# Patient Record
Sex: Female | Born: 1965 | Race: White | Hispanic: No | Marital: Married | Smoking: Never smoker
Health system: Southern US, Community
[De-identification: ages and names within clinical notes are randomized; demographics above are authoritative.]

## PROBLEM LIST (undated history)

## (undated) DIAGNOSIS — Z9889 Other specified postprocedural states: Secondary | ICD-10-CM

## (undated) DIAGNOSIS — I10 Essential (primary) hypertension: Secondary | ICD-10-CM

## (undated) DIAGNOSIS — F32A Depression, unspecified: Secondary | ICD-10-CM

## (undated) DIAGNOSIS — F419 Anxiety disorder, unspecified: Secondary | ICD-10-CM

## (undated) DIAGNOSIS — R112 Nausea with vomiting, unspecified: Secondary | ICD-10-CM

## (undated) DIAGNOSIS — Z8489 Family history of other specified conditions: Secondary | ICD-10-CM

## (undated) DIAGNOSIS — A630 Anogenital (venereal) warts: Secondary | ICD-10-CM

## (undated) DIAGNOSIS — F329 Major depressive disorder, single episode, unspecified: Secondary | ICD-10-CM

## (undated) HISTORY — DX: Depression, unspecified: F32.A

## (undated) HISTORY — DX: Anogenital (venereal) warts: A63.0

---
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
## 1997-12-17 HISTORY — PX: KNEE ARTHROSCOPY W/ ACL RECONSTRUCTION: SHX1858

## 1998-12-17 HISTORY — PX: LASIK: SHX215

## 2002-12-17 HISTORY — PX: TUBAL LIGATION: SHX77

## 2007-11-21 ENCOUNTER — Ambulatory Visit: Payer: Self-pay

## 2008-03-31 ENCOUNTER — Ambulatory Visit: Payer: Self-pay | Admitting: Orthopaedic Surgery

## 2008-04-08 ENCOUNTER — Ambulatory Visit: Payer: Self-pay | Admitting: Orthopaedic Surgery

## 2008-10-06 ENCOUNTER — Ambulatory Visit: Payer: Self-pay | Admitting: Obstetrics and Gynecology

## 2008-12-17 HISTORY — PX: BUNIONECTOMY: SHX129

## 2008-12-17 HISTORY — PX: SHOULDER ARTHROSCOPY WITH SUBACROMIAL DECOMPRESSION: SHX5684

## 2009-11-22 ENCOUNTER — Ambulatory Visit: Payer: Self-pay | Admitting: Obstetrics and Gynecology

## 2009-11-23 ENCOUNTER — Ambulatory Visit: Payer: Self-pay | Admitting: Obstetrics and Gynecology

## 2010-09-25 ENCOUNTER — Ambulatory Visit: Payer: Self-pay | Admitting: Orthopaedic Surgery

## 2010-12-17 HISTORY — PX: SHOULDER ARTHROSCOPY WITH SUBACROMIAL DECOMPRESSION: SHX5684

## 2011-01-19 ENCOUNTER — Ambulatory Visit: Payer: Self-pay | Admitting: Family Medicine

## 2011-07-17 ENCOUNTER — Encounter (HOSPITAL_BASED_OUTPATIENT_CLINIC_OR_DEPARTMENT_OTHER)
Admission: RE | Admit: 2011-07-17 | Discharge: 2011-07-17 | Disposition: A | Discharge status: Home / Self Care | Payer: Managed Care, Other (non HMO) | Source: Ambulatory Visit | Attending: Podiatry | Admitting: Podiatry

## 2011-07-17 LAB — BASIC METABOLIC PANEL
BUN: 11 mg/dL (ref 6–23)
Chloride: 96 mEq/L (ref 96–112)
GFR calc Af Amer: >60 mL/min (ref >60)
GFR calc non Af Amer: >60 mL/min (ref >60)
Potassium: 3.7 mEq/L (ref 3.5–5.1)
Sodium: 137 mEq/L (ref 135–145)

## 2011-07-20 ENCOUNTER — Ambulatory Visit (HOSPITAL_BASED_OUTPATIENT_CLINIC_OR_DEPARTMENT_OTHER)
Admission: RE | Admit: 2011-07-20 | Discharge: 2011-07-20 | Disposition: A | Discharge status: Home / Self Care | Payer: Managed Care, Other (non HMO) | Source: Ambulatory Visit | Attending: Podiatry | Admitting: Podiatry

## 2011-07-20 DIAGNOSIS — I10 Essential (primary) hypertension: Secondary | ICD-10-CM | POA: Insufficient documentation

## 2011-07-20 DIAGNOSIS — M26609 Unspecified temporomandibular joint disorder, unspecified side: Secondary | ICD-10-CM | POA: Insufficient documentation

## 2011-07-20 DIAGNOSIS — M24676 Ankylosis, unspecified foot: Secondary | ICD-10-CM | POA: Insufficient documentation

## 2011-07-20 DIAGNOSIS — T8489XA Other specified complication of internal orthopedic prosthetic devices, implants and grafts, initial encounter: Secondary | ICD-10-CM | POA: Insufficient documentation

## 2011-07-20 DIAGNOSIS — IMO0002 Reserved for concepts with insufficient information to code with codable children: Secondary | ICD-10-CM | POA: Insufficient documentation

## 2011-07-20 DIAGNOSIS — M24673 Ankylosis, unspecified ankle: Secondary | ICD-10-CM | POA: Insufficient documentation

## 2011-07-20 DIAGNOSIS — Z01810 Encounter for preprocedural cardiovascular examination: Secondary | ICD-10-CM | POA: Insufficient documentation

## 2011-07-20 DIAGNOSIS — F329 Major depressive disorder, single episode, unspecified: Secondary | ICD-10-CM | POA: Insufficient documentation

## 2011-07-20 DIAGNOSIS — Y838 Other surgical procedures as the cause of abnormal reaction of the patient, or of later complication, without mention of misadventure at the time of the procedure: Secondary | ICD-10-CM | POA: Insufficient documentation

## 2011-07-20 DIAGNOSIS — F3289 Other specified depressive episodes: Secondary | ICD-10-CM | POA: Insufficient documentation

## 2011-07-20 DIAGNOSIS — Z01812 Encounter for preprocedural laboratory examination: Secondary | ICD-10-CM | POA: Insufficient documentation

## 2012-10-06 ENCOUNTER — Ambulatory Visit: Payer: Self-pay | Admitting: Obstetrics and Gynecology

## 2013-12-29 ENCOUNTER — Ambulatory Visit: Payer: Self-pay | Admitting: Obstetrics and Gynecology

## 2015-08-01 ENCOUNTER — Other Ambulatory Visit: Payer: Self-pay | Admitting: Obstetrics and Gynecology

## 2015-08-01 DIAGNOSIS — Z1231 Encounter for screening mammogram for malignant neoplasm of breast: Secondary | ICD-10-CM

## 2015-08-23 ENCOUNTER — Ambulatory Visit
Admission: RE | Admit: 2015-08-23 | Discharge: 2015-08-23 | Disposition: A | Discharge status: Home / Self Care | Payer: 59 | Source: Ambulatory Visit | Attending: Obstetrics and Gynecology | Admitting: Obstetrics and Gynecology

## 2015-08-23 DIAGNOSIS — Z1231 Encounter for screening mammogram for malignant neoplasm of breast: Secondary | ICD-10-CM | POA: Insufficient documentation

## 2015-08-26 NOTE — H&P (Signed)
Allison Howard is an 49 y.o. female with abnormal uterine bleeding.  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 Prior workup 6 yrs ago. Has 3 children, all by C/S.  Pertinent gyn hx:  No abnormal paps S/p BTL S/p C/S x3   Patient concerns: AUB- increasing. Several times a month as a long cycle. Goes through several pads an hour on worst days.  Screening: Last pap: 2014 neg with neg HPV in 2013.  History of pap smears: No hx of high risk cervical lesions Last Mammogram: 2015 neg .   Patient's last menstrual period was 07/27/2015 (exact date).    Past Medical History:  has a past medical history of Depression; Panic disorder; and Hypertension.  Past Surgical History:  has past surgical history that includes Cryotherapy for venereal warts; Cesarean section; Right ACL reconstruction; and Lasik surgery. Family History: family history includes Heart disease in her other; Hypertension in her father and other; Prostate cancer in her father; Stroke in her other. Social History:  reports that she has never smoked. She does not have any smokeless tobacco history on file. She reports that she does not drink alcohol or use illicit drugs.  Allergies: has No Known Allergies. Medications:  Current outpatient prescriptions:  . triamterene-hydrochlorothiazide (MAXZIDE-25) 37.5-25 mg tablet, Take one tablet by mouth one time daily, Disp: 90 tablet, Rfl: 3  Review of Systems: Review of Systems  Constitutional: Negative. Negative for fever and fatigue.  HENT: Negative.  Respiratory: Negative. Negative for shortness of breath.  Cardiovascular: Negative for chest pain and palpitations.  Gastrointestinal: Negative for abdominal pain, diarrhea and constipation.  Endocrine: Negative for cold intolerance.  Genitourinary: Negative for dysuria, frequency, hematuria, vaginal bleeding, vaginal discharge, difficulty urinating, menstrual problem, pelvic pain and  dyspareunia.  Neurological: Negative for dizziness and light-headedness.  Psychiatric/Behavioral:  No changes in mood     Exam:      Filed Vitals:   07/22/15 1541  BP: 128/71  Pulse: 76    Body mass index is 25.37 kg/(m^2).  Physical Exam  Constitutional: She is oriented to person, place, and time. She appears well-developed and well-nourished. No distress.  Eyes: No scleral icterus.  Neck: Normal range of motion. Neck supple. No tracheal deviation present. No thyromegaly present.  Cardiovascular: Normal rate, regular rhythm and normal heart sounds.  No murmur heard. Pulmonary/Chest: Effort normal and breath sounds normal. She has no wheezes. She has no rales. Right breast exhibits no inverted nipple, no mass, no nipple discharge, no skin change and no tenderness. Left breast exhibits no inverted nipple, no mass, no nipple discharge, no skin change and no tenderness. Breasts are symmetrical.  Abdominal: She exhibits no distension and no mass. There is no tenderness. There is no rebound and no guarding.  Genitourinary:  Pelvic exam: External: Tanner stage 5, normal female genitalia without lesions or masses Bladder: Normal size without masses or tenderness, well-supported Urethra: No lesions or discharge with palpation. Normal urethral size and location, no prolapse Vagina: normal physiological discharge, without lesions or masses Cervix: normal without lesions or masses Adnexa: normal bimanual exam without masses or fullness Uterus: Normal size and position without masses or tenderness.  Anus/Perineum: Normal external exam  Musculoskeletal: Normal range of motion.  Lymphadenopathy:  She has no cervical adenopathy.  Neurological: She is alert and oriented to person, place, and time.  Skin: Skin is warm and dry.  Psychiatric: She has a normal mood and affect.   ROS  Last menstrual period 07/27/2015. Physical  Exam  Assessment/Plan:  Failed SIS. Thick ES today  consistent with intramenses; however, patient aware that until OR visit and hysteroscopy, we do not have pathology nor do we know that no polyps or fibroids in endometrial cavity.   Plan for OR for D&C, hysteroscopy and Novasure ablation. Pt aware if pathology findings concerning for cancer, may need further procedures, including myomectomy, polypectomy. In some cases, our findings might preclude a Novasure ablation.  Risks and benefits of procedure discussed in detail.   Benjaman Kindler 08/26/2015, 2:23 PM

## 2015-08-31 ENCOUNTER — Encounter
Admission: RE | Admit: 2015-08-31 | Discharge: 2015-08-31 | Disposition: A | Discharge status: Home / Self Care | Payer: 59 | Source: Ambulatory Visit | Attending: Obstetrics and Gynecology | Admitting: Obstetrics and Gynecology

## 2015-08-31 DIAGNOSIS — Z01810 Encounter for preprocedural cardiovascular examination: Secondary | ICD-10-CM | POA: Diagnosis not present

## 2015-08-31 DIAGNOSIS — Z01812 Encounter for preprocedural laboratory examination: Secondary | ICD-10-CM | POA: Insufficient documentation

## 2015-08-31 HISTORY — DX: Other specified postprocedural states: Z98.890

## 2015-08-31 HISTORY — DX: Essential (primary) hypertension: I10

## 2015-08-31 HISTORY — DX: Nausea with vomiting, unspecified: R11.2

## 2015-08-31 HISTORY — DX: Anxiety disorder, unspecified: F41.9

## 2015-08-31 HISTORY — DX: Family history of other specified conditions: Z84.89

## 2015-08-31 LAB — BASIC METABOLIC PANEL
ANION GAP: 9 (ref 5–15)
BUN: 16 mg/dL (ref 6–20)
CALCIUM: 9.5 mg/dL (ref 8.9–10.3)
CO2: 31 mmol/L (ref 22–32)
Chloride: 99 mmol/L — ABNORMAL LOW (ref 101–111)
Creatinine, Ser: 1.03 mg/dL — ABNORMAL HIGH (ref 0.44–1.00)
GLUCOSE: 87 mg/dL (ref 65–99)
Potassium: 3.2 mmol/L — ABNORMAL LOW (ref 3.5–5.1)
SODIUM: 139 mmol/L (ref 135–145)

## 2015-08-31 LAB — CBC
HCT: 36.3 % (ref 35.0–47.0)
HEMOGLOBIN: 11.6 g/dL — AB (ref 12.0–16.0)
MCH: 25.5 pg — ABNORMAL LOW (ref 26.0–34.0)
MCHC: 32.1 g/dL (ref 32.0–36.0)
MCV: 79.5 fL — ABNORMAL LOW (ref 80.0–100.0)
Platelets: 269 10*3/uL (ref 150–440)
RBC: 4.56 MIL/uL (ref 3.80–5.20)
RDW: 16 % — AB (ref 11.5–14.5)
WBC: 5.1 10*3/uL (ref 3.6–11.0)

## 2015-08-31 NOTE — Patient Instructions (Signed)
  Your procedure is scheduled on: Monday Sept. 19, 2016. Report to Same Day Surgery. To find out your arrival time please call [phone] between 1PM - 3PM on Friday Sept.16, 2016 .  Remember: Instructions that are not followed completely may result in serious medical risk, up to and including death, or upon the discretion of your surgeon and anesthesiologist your surgery may need to be rescheduled.    __x__ 1. Do not eat food or drink liquids after midnight. No gum chewing or hard candies.     __x__ 2. No Alcohol for 24 hours before or after surgery.   ____ 3. Bring all medications with you on the day of surgery if instructed.    __x__ 4. Notify your doctor if there is any change in your medical condition     (cold, fever, infections).     Do not wear jewelry, make-up, hairpins, clips or nail polish.  Do not wear lotions, powders, or perfumes. You may wear deodorant.  Do not shave 48 hours prior to surgery. Men may shave face and neck.  Do not bring valuables to the hospital.    [facility] is not responsible for any belongings or valuables.               Contacts, dentures or bridgework may not be worn into surgery.  Leave your suitcase in the car. After surgery it may be brought to your room.  For patients admitted to the hospital, discharge time is determined by your treatment team.   Patients discharged the day of surgery will not be allowed to drive home.    Please read over the following fact sheets that you were given:   [facility] Preparing for Surgery  _x___ Take these medicines the morning of surgery with A SIP OF WATER:    1. Alprazolam is optional  ____ Fleet Enema (as directed)   ____ Use CHG Soap as directed  ____ Use inhalers on the day of surgery  ____ Stop metformin 2 days prior to surgery    ____ Take 1/2 of usual insulin dose the night before surgery and none on the morning of surgery.   ____ Stop Coumadin/Plavix/aspirin on does not apply.  __x__  Stop Anti-inflammatories now.  Can take Tylenol for pain.   ____ Stop supplements until after surgery.    ____ Bring C-Pap to the hospital.

## 2015-09-02 ENCOUNTER — Telehealth: Payer: Self-pay | Admitting: Obstetrics and Gynecology

## 2015-09-02 NOTE — Telephone Encounter (Signed)
Preop potassium low at 3.2.  Pt instructed to take 20 mEq of KCl po BID until surgery.

## 2015-09-05 ENCOUNTER — Encounter: Payer: Self-pay | Admitting: *Deleted

## 2015-09-05 ENCOUNTER — Ambulatory Visit: Payer: 59 | Admitting: Anesthesiology

## 2015-09-05 ENCOUNTER — Ambulatory Visit
Admission: RE | Admit: 2015-09-05 | Discharge: 2015-09-05 | Disposition: A | Discharge status: Home / Self Care | Payer: 59 | Source: Ambulatory Visit | Attending: Obstetrics and Gynecology | Admitting: Obstetrics and Gynecology

## 2015-09-05 ENCOUNTER — Encounter
Admission: RE | Disposition: A | Discharge status: Home / Self Care | Payer: Self-pay | Source: Ambulatory Visit | Attending: Obstetrics and Gynecology

## 2015-09-05 DIAGNOSIS — N882 Stricture and stenosis of cervix uteri: Secondary | ICD-10-CM | POA: Diagnosis not present

## 2015-09-05 DIAGNOSIS — Z8042 Family history of malignant neoplasm of prostate: Secondary | ICD-10-CM | POA: Insufficient documentation

## 2015-09-05 DIAGNOSIS — N939 Abnormal uterine and vaginal bleeding, unspecified: Secondary | ICD-10-CM | POA: Insufficient documentation

## 2015-09-05 DIAGNOSIS — Z8249 Family history of ischemic heart disease and other diseases of the circulatory system: Secondary | ICD-10-CM | POA: Diagnosis not present

## 2015-09-05 DIAGNOSIS — O3421 Maternal care for scar from previous cesarean delivery: Secondary | ICD-10-CM | POA: Insufficient documentation

## 2015-09-05 DIAGNOSIS — I10 Essential (primary) hypertension: Secondary | ICD-10-CM | POA: Insufficient documentation

## 2015-09-05 DIAGNOSIS — Z79899 Other long term (current) drug therapy: Secondary | ICD-10-CM | POA: Diagnosis not present

## 2015-09-05 DIAGNOSIS — F329 Major depressive disorder, single episode, unspecified: Secondary | ICD-10-CM | POA: Diagnosis not present

## 2015-09-05 DIAGNOSIS — Z823 Family history of stroke: Secondary | ICD-10-CM | POA: Diagnosis not present

## 2015-09-05 HISTORY — PX: CYSTOSCOPY: SHX5120

## 2015-09-05 HISTORY — PX: DILITATION & CURRETTAGE/HYSTROSCOPY WITH NOVASURE ABLATION: SHX5568

## 2015-09-05 LAB — TYPE AND SCREEN
ABO/RH(D): A POS
ANTIBODY SCREEN: NEGATIVE

## 2015-09-05 LAB — I-STAT 4 (NA, K, GLUC, HGB, HCT): Potassium: 3.2 mmol/L — ABNORMAL LOW

## 2015-09-05 LAB — POCT I-STAT 4, (NA,K, GLUC, HGB,HCT)
Glucose, Bld: 85 mg/dL (ref 65–99)
HCT: 36 % (ref 36.0–46.0)
Hemoglobin: 12.2 g/dL (ref 12.0–15.0)
Potassium: 3.2 mmol/L — ABNORMAL LOW (ref 3.5–5.1)
Sodium: 138 mmol/L (ref 135–145)

## 2015-09-05 LAB — ABO/RH: ABO/RH(D): A POS

## 2015-09-05 LAB — POCT PREGNANCY, URINE: Preg Test, Ur: NEGATIVE

## 2015-09-05 SURGERY — DILATATION & CURETTAGE/HYSTEROSCOPY WITH NOVASURE ABLATION
Anesthesia: General

## 2015-09-05 MED ORDER — DOCUSATE SODIUM 100 MG PO CAPS: 100.0000 mg | ORAL_CAPSULE | Freq: Two times a day (BID) | ORAL | Status: DC | PRN

## 2015-09-05 MED ORDER — DOCUSATE SODIUM 100 MG PO CAPS
100.0000 mg | ORAL_CAPSULE | Freq: Two times a day (BID) | ORAL | Status: DC | PRN
  Filled 2015-09-05: qty 1

## 2015-09-05 MED ORDER — IBUPROFEN 800 MG PO TABS: 800.0000 mg | ORAL_TABLET | Freq: Three times a day (TID) | ORAL | Status: DC | PRN

## 2015-09-05 MED ORDER — LACTATED RINGERS IV SOLN
INTRAVENOUS | Status: DC
  Administered 2015-09-05: 07:00:00 via INTRAVENOUS

## 2015-09-05 MED ORDER — GLYCOPYRROLATE 0.2 MG/ML IJ SOLN
INTRAMUSCULAR | Status: DC | PRN
  Administered 2015-09-05: .1 mg via INTRAVENOUS

## 2015-09-05 MED ORDER — MIDAZOLAM HCL 5 MG/5ML IJ SOLN
INTRAMUSCULAR | Status: DC | PRN
  Administered 2015-09-05: 2 mg via INTRAVENOUS

## 2015-09-05 MED ORDER — ONDANSETRON HCL 4 MG/2ML IJ SOLN
INTRAMUSCULAR | Status: DC | PRN
  Administered 2015-09-05: 4 mg via INTRAVENOUS

## 2015-09-05 MED ORDER — SCOPOLAMINE 1 MG/3DAYS TD PT72
1.0000 | MEDICATED_PATCH | Freq: Once | TRANSDERMAL | Status: DC
  Administered 2015-09-05: 1.5 mg via TRANSDERMAL

## 2015-09-05 MED ORDER — LIDOCAINE HCL (CARDIAC) 20 MG/ML IV SOLN
INTRAVENOUS | Status: DC | PRN
Start: 2015-09-05 — End: 2015-09-05
  Administered 2015-09-05: 40 mg via INTRAVENOUS

## 2015-09-05 MED ORDER — FENTANYL CITRATE (PF) 100 MCG/2ML IJ SOLN
INTRAMUSCULAR | Status: DC | PRN
  Administered 2015-09-05: 100 ug via INTRAVENOUS

## 2015-09-05 MED ORDER — FAMOTIDINE 20 MG PO TABS
20.0000 mg | ORAL_TABLET | Freq: Once | ORAL | Status: AC
  Administered 2015-09-05: 20 mg via ORAL

## 2015-09-05 MED ORDER — SILVER NITRATE-POT NITRATE 75-25 % EX MISC
CUTANEOUS | Status: AC
  Filled 2015-09-05: qty 2

## 2015-09-05 MED ORDER — PROPOFOL 10 MG/ML IV BOLUS
INTRAVENOUS | Status: DC | PRN
  Administered 2015-09-05: 150 mg via INTRAVENOUS

## 2015-09-05 MED ORDER — FENTANYL CITRATE (PF) 100 MCG/2ML IJ SOLN: 25.0000 ug | INTRAMUSCULAR | Status: DC | PRN

## 2015-09-05 MED ORDER — DEXAMETHASONE SODIUM PHOSPHATE 10 MG/ML IJ SOLN
INTRAMUSCULAR | Status: DC | PRN
  Administered 2015-09-05: 5 mg via INTRAVENOUS

## 2015-09-05 MED ORDER — ONDANSETRON HCL 4 MG/2ML IJ SOLN
4.0000 mg | Freq: Once | INTRAMUSCULAR | Status: DC | PRN
Start: 2015-09-05 — End: 2015-09-05

## 2015-09-05 MED ORDER — OXYCODONE-ACETAMINOPHEN 5-325 MG PO TABS: 1.0000 | ORAL_TABLET | Freq: Four times a day (QID) | ORAL | Status: DC | PRN

## 2015-09-05 MED ORDER — KETOROLAC TROMETHAMINE 30 MG/ML IJ SOLN
INTRAMUSCULAR | Status: DC | PRN
  Administered 2015-09-05: 30 mg via INTRAVENOUS

## 2015-09-05 SURGICAL SUPPLY — 15 items
CANISTER SUCT 1200ML W/VALVE (MISCELLANEOUS) ×2 IMPLANT
CATH ROBINSON RED A/P 16FR (CATHETERS) ×2 IMPLANT
GLOVE BIO SURGEON STRL SZ 6 (GLOVE) ×2 IMPLANT
GLOVE INDICATOR 6.5 STRL GRN (GLOVE) ×2 IMPLANT
GOWN STRL REUS W/ TWL LRG LVL3 (GOWN DISPOSABLE) ×2 IMPLANT
GOWN STRL REUS W/TWL LRG LVL3 (GOWN DISPOSABLE) ×2
IV LACTATED RINGERS 1000ML (IV SOLUTION) ×2 IMPLANT
KIT RM TURNOVER CYSTO AR (KITS) ×2 IMPLANT
NOVASURE ENDOMETRIAL ABLATION (MISCELLANEOUS) ×2 IMPLANT
NS IRRIG 500ML POUR BTL (IV SOLUTION) ×2 IMPLANT
PACK DNC HYST (MISCELLANEOUS) ×2 IMPLANT
PAD OB MATERNITY 4.3X12.25 (PERSONAL CARE ITEMS) ×2 IMPLANT
PAD PREP 24X41 OB/GYN DISP (PERSONAL CARE ITEMS) ×2 IMPLANT
TOWEL OR 17X26 4PK STRL BLUE (TOWEL DISPOSABLE) ×2 IMPLANT
TUBING CONNECTING 10 (TUBING) ×2 IMPLANT

## 2015-09-05 NOTE — Anesthesia Preprocedure Evaluation (Signed)
Anesthesia Evaluation  Patient identified by MRN, date of birth, ID band Patient awake    Reviewed: Allergy & Precautions, NPO status , Patient's Chart, lab work & pertinent test results  History of Anesthesia Complications (+) PONVNegative for: history of anesthetic complications  Airway Mallampati: II  TM Distance: >3 FB Neck ROM: Full    Dental  (+) Teeth Intact   Pulmonary neg pulmonary ROS,           Cardiovascular hypertension, Pt. on medications      Neuro/Psych negative neurological ROS     GI/Hepatic   Endo/Other    Renal/GU      Musculoskeletal   Abdominal   Peds  Hematology   Anesthesia Other Findings   Reproductive/Obstetrics                             Anesthesia Physical Anesthesia Plan  ASA: II  Anesthesia Plan: General   Post-op Pain Management:    Induction: Intravenous  Airway Management Planned: LMA  Additional Equipment:   Intra-op Plan:   Post-operative Plan:   Informed Consent: I have reviewed the patients History and Physical, chart, labs and discussed the procedure including the risks, benefits and alternatives for the proposed anesthesia with the patient or authorized representative who has indicated his/her understanding and acceptance.     Plan Discussed with:   Anesthesia Plan Comments:         Anesthesia Quick Evaluation

## 2015-09-05 NOTE — Discharge Instructions (Signed)
Discharge instructions after a hysteroscopy with dilation and curettage  Signs and Symptoms to Report  Call our office at [phone] if you have any of the following:   . Fever over 100.4 degrees or higher . Severe stomach pain not relieved with pain medications . Bright red bleeding that's heavier than a period that does not slow with rest after the first 24 hours . To go the bathroom a lot (frequency), you can't hold your urine (urgency), or it hurts when you empty your bladder (urinate) . Chest pain . Shortness of breath . Pain in the calves of your legs . Severe nausea and vomiting not relieved with anti-nausea medications . Any concerns  What You Can Expect after Surgery . You may see some pink tinged, bloody fluid. This is normal. You may also have cramping for several days.   Activities after Your Discharge Follow these guidelines to help speed your recovery at home: . Don't drive if you are in pain or taking narcotic pain medicine. You may drive when you can safely slam on the brakes, turn the wheel forcefully, and rotate your torso comfortably. This is typically 4-7 days. Practice in a parking lot or side street prior to attempting to drive regularly.  . Ask others to help with household chores for 4 weeks. . Don't do strenuous activities, exercises, or sports like vacuuming, tennis, squash, etc. until your doctor says it is safe to do so. . Walk as you feel able. Rest often since it may take a week or two for your energy level to return to normal.  . You may climb stairs . Avoid constipation:   -Eat fruits, vegetables, and whole grains. Eat small meals as your appetite will take time to return to normal.   -Drink 6 to 8 glasses of water each day unless your doctor has told you to limit your fluids.   -Use a laxative or stool softener as needed if constipation becomes a problem. You may take Miralax, metamucil, Citrucil, Colace, Senekot, FiberCon, etc. If this does not  relieve the constipation, try two tablespoons of Milk Of Magnesia every 8 hours until your bowels move.  . You may shower.  . Do not get in a hot tub, swimming pool, etc. until your doctor agrees. . Do not douche, use tampons, or have sex until your doctor says it is okay, usually about 2 weeks. . Take your pain medicine when you need it. The medicine may not work as well if the pain is bad.  Take the medicines you were taking before surgery. Other medications you might need are pain medications (ibuprofen), medications for constipation (Colace) and nausea medications (Zofran).        AMBULATORY SURGERY  DISCHARGE INSTRUCTIONS   1) The drugs that you were given will stay in your system until tomorrow so for the next 24 hours you should not:  A) Drive an automobile B) Make any legal decisions C) Drink any alcoholic beverage   2) You may resume regular meals tomorrow.  Today it is better to start with liquids and gradually work up to solid foods.  You may eat anything you prefer, but it is better to start with liquids, then soup and crackers, and gradually work up to solid foods.   3) Please notify your doctor immediately if you have any unusual bleeding, trouble breathing, redness and pain at the surgery site, drainage, fever, or pain not relieved by medication.    4) Additional Instructions:          Please contact your physician with any problems or Same Day Surgery at [phone], Monday through Friday 6 am to 4 pm, or [facility] number at [phone]. 

## 2015-09-05 NOTE — Anesthesia Procedure Notes (Signed)
Procedure Name: LMA Insertion Date/Time: 09/05/2015 7:45 AM Performed by: Kennon Holter Pre-anesthesia Checklist: Patient identified, Emergency Drugs available, Suction available, Patient being monitored and Timeout performed Patient Re-evaluated:Patient Re-evaluated prior to inductionOxygen Delivery Method: Circle system utilized Preoxygenation: Pre-oxygenation with 100% oxygen Intubation Type: IV induction LMA: LMA inserted LMA Size: 4.0 Number of attempts: 1 Placement Confirmation: positive ETCO2 and breath sounds checked- equal and bilateral

## 2015-09-05 NOTE — OR Nursing (Signed)
Dr. Ronelle Nigh notified of Potassium 3.2. No orders received.

## 2015-09-05 NOTE — Anesthesia Postprocedure Evaluation (Signed)
  Anesthesia Post-op Note  Patient: Allison Howard  Procedure(s) Performed: Procedure(s): FRACTIONAL DILATATION & CURETTAGE/HYSTEROSCOPY WITH NOVASURE ABLATION (N/A) CYSTOSCOPY, URETHOSCOPY (N/A)  Anesthesia type:General  Patient location: PACU  Post pain: Pain level controlled  Post assessment: Post-op Vital signs reviewed, Patient's Cardiovascular Status Stable, Respiratory Function Stable, Patent Airway and No signs of Nausea or vomiting  Post vital signs: Reviewed and stable  Last Vitals:  Filed Vitals:   09/05/15 0938  BP: 132/80  Pulse: 58  Temp:   Resp: 13    Level of consciousness: awake, alert  and patient cooperative  Complications: No apparent anesthesia complications

## 2015-09-05 NOTE — Op Note (Signed)
Operative Report Hysteroscopy with Dilation and Curettage; Novasure ablation   Indications: Abnormal uterine bleeding, cervical stenosis, 3 prior cesarean sections   Pre-operative Diagnosis: Abnormal uterine bleeding   Post-operative Diagnosis: same.  Procedure: 1. Exam under anesthesia 2. Fractional D&C with endocervical curettage  3. Hysteroscopy 4. Novasure endometrial ablation 5. Cystourethroscopy  Surgeon: Angelina Pih, MD  Assistant(s):  None  Anesthesia: General endotracheal anesthesia  Estimated Blood Loss:  Minimal         Intraoperative medications: toradol         Total IV Fluids: 559ml  Urine Output: 141ml         Specimens: Endocervical curettings, endometrial curettings         Complications:  None; patient tolerated the procedure well.         Disposition: PACU - hemodynamically stable.         Condition: stable  Findings: Uterus measuring 9 cm by sound; normal cervix, vagina, perineum. Cervical length: 4.5 cm Uterine cavity length: 4.5 cm Uterine cavity width: 3.6 cm Power in watts: 79 Total time: 1 min 30 sec  Indication for procedure/Consents: 49 y.o. P3  here for scheduled surgery for the aforementioned diagnoses.  She was attempted EMBx in the office, which failed due to cervical stenosis. Risks of surgery were discussed with the patient including but not limited to: bleeding which may require transfusion; infection which may require antibiotics; injury to bladder, uterus or surrounding organs requiring further procedures; intrauterine scarring which may impair future fertility; need for additional procedures including laparotomy or laparoscopy; and other postoperative/anesthesia complications. Written informed consent was obtained.    Procedure Details:   The patient was taken to the operating room where general anesthesia was administered and was found to be adequate. After a formal and adequate timeout was performed, she was placed in  the dorsal lithotomy position and examined with the above findings. She was then prepped and draped in the sterile manner. Her bladder was catheterized for an estimated amount of clear, yellow urine. A weighed speculum was then placed in the patient's vagina and a single tooth tenaculum was applied to the anterior lip of the cervix.  An endocervical currettage was performed. Her cervix was serially dilated to 15 [place] using Hanks dilators.The hysteroscope was introduced to reveal the above findings.The hysteroscope was also used to determine the level of the internal os, and measurements were confirmed. The uterine cavity was carefully examined, both ostia were recognized, and diffusely proliferative endometrium with polypoid fragments was noted.  A sharp curettage was then performed until there was a gritty texture in all four quadrants.   NOVASURE PROCEDURE DETAILS:   The cervix was further dilated to accommodate the NovaSure device.  The NovaSure device was inserted, and the cavity width was determined. Using a power of 79 watts, for 90 sec, the endometrial ablation was performed. The hysteroscope was not re-introduced into the uterine cavity, to decrease the risk of pelvic infection. The tenaculum was removed from the anterior lip of the cervix, and the vaginal speculum was removed after noting good hemostasis.    An uncomplicated cystourethroscopy was performed to assess integrity of bladder mucosa. No areas of abnormality were noted.  She received iv acetaminophen and Toradol prior to leaving the OR. The patient tolerated the procedure well and was taken to the recovery area awake, extubated and in stable condition.  The patient will be discharged to home as per PACU criteria.  Routine postoperative instructions given.  She was  prescribed Percocet, Ibuprofen and Colace.  She will follow up in the clinic in two weeks for postoperative evaluation.

## 2015-09-05 NOTE — Interval H&P Note (Signed)
History and Physical Interval Note:  09/05/2015 7:28 AM  Allison Howard  has presented today for surgery, with the diagnosis of ABNORMAL UTERINE BLEEDING  The various methods of treatment have been discussed with the patient and family. After consideration of risks, benefits and other options for treatment, the patient has consented to  Procedure(s): [place] (N/A) as a surgical intervention .  The patient's history has been reviewed, patient examined, no change in status, stable for surgery.  I have reviewed the patient's chart and labs.  Questions were answered to the patient's satisfaction.     Benjaman Kindler

## 2015-09-05 NOTE — Transfer of Care (Signed)
Immediate Anesthesia Transfer of Care Note  Patient: Allison Howard  Procedure(s) Performed: Procedure(s): FRACTIONAL DILATATION & CURETTAGE/HYSTEROSCOPY WITH NOVASURE ABLATION (N/A) CYSTOSCOPY, URETHOSCOPY (N/A)  Patient Location: PACU  Anesthesia Type:General  Level of Consciousness: awake, alert  and oriented  Airway & Oxygen Therapy: Patient Spontanous Breathing and Patient connected to face mask oxygen  Post-op Assessment: Report given to RN and Post -op Vital signs reviewed and stable  Post vital signs: Reviewed and stable  Last Vitals:  Filed Vitals:   09/05/15 0838  BP:   Pulse:   Temp: 36.1 C  Resp:     Complications: No apparent anesthesia complications

## 2015-09-06 ENCOUNTER — Encounter: Payer: Self-pay | Admitting: Obstetrics and Gynecology

## 2015-09-06 LAB — SURGICAL PATHOLOGY

## 2016-09-06 ENCOUNTER — Other Ambulatory Visit: Payer: Self-pay | Admitting: Family Medicine

## 2016-09-06 DIAGNOSIS — Z1231 Encounter for screening mammogram for malignant neoplasm of breast: Secondary | ICD-10-CM

## 2016-10-01 ENCOUNTER — Ambulatory Visit
Admission: RE | Admit: 2016-10-01 | Discharge: 2016-10-01 | Disposition: A | Discharge status: Home / Self Care | Payer: 59 | Source: Ambulatory Visit | Attending: Family Medicine | Admitting: Family Medicine

## 2016-10-01 DIAGNOSIS — Z1231 Encounter for screening mammogram for malignant neoplasm of breast: Secondary | ICD-10-CM | POA: Insufficient documentation

## 2017-03-18 DIAGNOSIS — I10 Essential (primary) hypertension: Secondary | ICD-10-CM | POA: Diagnosis not present

## 2017-03-18 DIAGNOSIS — L84 Corns and callosities: Secondary | ICD-10-CM | POA: Diagnosis not present

## 2017-06-18 DIAGNOSIS — M25476 Effusion, unspecified foot: Secondary | ICD-10-CM | POA: Diagnosis not present

## 2017-06-18 DIAGNOSIS — M25473 Effusion, unspecified ankle: Secondary | ICD-10-CM | POA: Diagnosis not present

## 2017-09-10 DIAGNOSIS — N951 Menopausal and female climacteric states: Secondary | ICD-10-CM | POA: Diagnosis not present

## 2017-09-10 DIAGNOSIS — I10 Essential (primary) hypertension: Secondary | ICD-10-CM | POA: Diagnosis not present

## 2017-09-10 DIAGNOSIS — Z0000 Encounter for general adult medical examination without abnormal findings: Secondary | ICD-10-CM | POA: Diagnosis not present

## 2017-11-12 DIAGNOSIS — J069 Acute upper respiratory infection, unspecified: Secondary | ICD-10-CM | POA: Diagnosis not present

## 2018-01-27 ENCOUNTER — Other Ambulatory Visit: Payer: Self-pay | Admitting: Family Medicine

## 2018-01-27 DIAGNOSIS — Z1231 Encounter for screening mammogram for malignant neoplasm of breast: Secondary | ICD-10-CM

## 2018-02-13 ENCOUNTER — Ambulatory Visit
Admission: RE | Admit: 2018-02-13 | Discharge: 2018-02-13 | Disposition: A | Discharge status: Home / Self Care | Payer: 59 | Source: Ambulatory Visit | Attending: Family Medicine | Admitting: Family Medicine

## 2018-02-13 DIAGNOSIS — Z1231 Encounter for screening mammogram for malignant neoplasm of breast: Secondary | ICD-10-CM | POA: Diagnosis present

## 2018-08-12 DIAGNOSIS — Z01419 Encounter for gynecological examination (general) (routine) without abnormal findings: Secondary | ICD-10-CM | POA: Diagnosis not present

## 2018-08-22 DIAGNOSIS — M654 Radial styloid tenosynovitis [de Quervain]: Secondary | ICD-10-CM | POA: Diagnosis not present

## 2018-11-18 DIAGNOSIS — R635 Abnormal weight gain: Secondary | ICD-10-CM | POA: Diagnosis not present

## 2018-11-18 DIAGNOSIS — Z0000 Encounter for general adult medical examination without abnormal findings: Secondary | ICD-10-CM | POA: Diagnosis not present

## 2018-11-18 DIAGNOSIS — I10 Essential (primary) hypertension: Secondary | ICD-10-CM | POA: Diagnosis not present

## 2018-11-19 DIAGNOSIS — R635 Abnormal weight gain: Secondary | ICD-10-CM | POA: Diagnosis not present

## 2018-11-28 DIAGNOSIS — R079 Chest pain, unspecified: Secondary | ICD-10-CM | POA: Diagnosis not present

## 2019-01-21 DIAGNOSIS — M7061 Trochanteric bursitis, right hip: Secondary | ICD-10-CM | POA: Diagnosis not present

## 2019-01-21 DIAGNOSIS — M25551 Pain in right hip: Secondary | ICD-10-CM | POA: Diagnosis not present

## 2019-07-06 ENCOUNTER — Encounter (INDEPENDENT_AMBULATORY_CARE_PROVIDER_SITE_OTHER): Payer: 59 | Admitting: Ophthalmology

## 2019-07-06 ENCOUNTER — Other Ambulatory Visit: Payer: Self-pay

## 2019-07-06 DIAGNOSIS — H35033 Hypertensive retinopathy, bilateral: Secondary | ICD-10-CM

## 2019-07-06 DIAGNOSIS — I10 Essential (primary) hypertension: Secondary | ICD-10-CM

## 2019-07-06 DIAGNOSIS — H43813 Vitreous degeneration, bilateral: Secondary | ICD-10-CM | POA: Diagnosis not present

## 2019-07-06 DIAGNOSIS — D3131 Benign neoplasm of right choroid: Secondary | ICD-10-CM

## 2020-04-07 ENCOUNTER — Other Ambulatory Visit: Payer: Self-pay

## 2020-04-07 ENCOUNTER — Encounter: Payer: Self-pay | Admitting: Emergency Medicine

## 2020-04-07 ENCOUNTER — Emergency Department
Admission: EM | Admit: 2020-04-07 | Discharge: 2020-04-08 | Disposition: A | Discharge status: Home / Self Care | Payer: 59 | Attending: Emergency Medicine | Admitting: Emergency Medicine

## 2020-04-07 ENCOUNTER — Emergency Department: Payer: 59

## 2020-04-07 DIAGNOSIS — J1282 Pneumonia due to coronavirus disease 2019: Secondary | ICD-10-CM | POA: Insufficient documentation

## 2020-04-07 DIAGNOSIS — U071 COVID-19: Secondary | ICD-10-CM | POA: Diagnosis present

## 2020-04-07 DIAGNOSIS — R110 Nausea: Secondary | ICD-10-CM | POA: Diagnosis not present

## 2020-04-07 DIAGNOSIS — R0602 Shortness of breath: Secondary | ICD-10-CM | POA: Insufficient documentation

## 2020-04-07 DIAGNOSIS — Z79899 Other long term (current) drug therapy: Secondary | ICD-10-CM | POA: Insufficient documentation

## 2020-04-07 DIAGNOSIS — I10 Essential (primary) hypertension: Secondary | ICD-10-CM | POA: Insufficient documentation

## 2020-04-07 DIAGNOSIS — M546 Pain in thoracic spine: Secondary | ICD-10-CM | POA: Insufficient documentation

## 2020-04-07 DIAGNOSIS — R509 Fever, unspecified: Secondary | ICD-10-CM | POA: Diagnosis not present

## 2020-04-07 LAB — CBC WITH DIFFERENTIAL/PLATELET
Abs Immature Granulocytes: 0.02 10*3/uL (ref 0.00–0.07)
Basophils Absolute: 0 10*3/uL (ref 0.0–0.1)
Basophils Relative: 0 %
Eosinophils Absolute: 0 10*3/uL (ref 0.0–0.5)
Eosinophils Relative: 0 %
HCT: 43 % (ref 36.0–46.0)
Hemoglobin: 14.8 g/dL (ref 12.0–15.0)
Immature Granulocytes: 1 %
Lymphocytes Relative: 26 %
Lymphs Abs: 1.1 10*3/uL (ref 0.7–4.0)
MCH: 30.6 pg (ref 26.0–34.0)
MCHC: 34.4 g/dL (ref 30.0–36.0)
MCV: 88.8 fL (ref 80.0–100.0)
Monocytes Absolute: 0.2 10*3/uL (ref 0.1–1.0)
Monocytes Relative: 6 %
Neutro Abs: 2.8 10*3/uL (ref 1.7–7.7)
Neutrophils Relative %: 67 %
Platelets: 176 10*3/uL (ref 150–400)
RBC: 4.84 MIL/uL (ref 3.87–5.11)
RDW: 12.7 % (ref 11.5–15.5)
WBC: 4.2 10*3/uL (ref 4.0–10.5)
nRBC: 0 % (ref 0.0–0.2)

## 2020-04-07 LAB — COMPREHENSIVE METABOLIC PANEL
ALT: 21 U/L (ref 0–44)
AST: 35 U/L (ref 15–41)
Albumin: 3.7 g/dL (ref 3.5–5.0)
Alkaline Phosphatase: 54 U/L (ref 38–126)
Anion gap: 9 (ref 5–15)
BUN: 22 mg/dL — ABNORMAL HIGH (ref 6–20)
CO2: 35 mmol/L — ABNORMAL HIGH (ref 22–32)
Calcium: 8.6 mg/dL — ABNORMAL LOW (ref 8.9–10.3)
Chloride: 91 mmol/L — ABNORMAL LOW (ref 98–111)
Creatinine, Ser: 1.1 mg/dL — ABNORMAL HIGH (ref 0.44–1.00)
GFR calc Af Amer: >60 mL/min (ref >60)
GFR calc non Af Amer: 58 mL/min — ABNORMAL LOW (ref >60)
Glucose, Bld: 135 mg/dL — ABNORMAL HIGH (ref 70–99)
Potassium: 3 mmol/L — ABNORMAL LOW (ref 3.5–5.1)
Sodium: 135 mmol/L (ref 135–145)
Total Bilirubin: 0.8 mg/dL (ref 0.3–1.2)
Total Protein: 7.1 g/dL (ref 6.5–8.1)

## 2020-04-07 LAB — TROPONIN I (HIGH SENSITIVITY): Troponin I (High Sensitivity): 8 ng/L (ref <18)

## 2020-04-07 NOTE — ED Triage Notes (Signed)
Patient ambulatory to triage with steady gait, without difficulty or distress noted, mask in place; +COVID yesterday; c/o fever 102 with chills, cough, SHOB and upper back pain

## 2020-04-08 LAB — ABO/RH: ABO/RH(D): A POS

## 2020-04-08 LAB — FIBRIN DERIVATIVES D-DIMER (ARMC ONLY): Fibrin derivatives D-dimer (ARMC): 309.28 ng/mL (FEU) (ref 0.00–499.00)

## 2020-04-08 LAB — PROCALCITONIN: Procalcitonin: <0.1 ng/mL

## 2020-04-08 LAB — HIV ANTIBODY (ROUTINE TESTING W REFLEX): HIV Screen 4th Generation wRfx: NONREACTIVE

## 2020-04-08 LAB — BRAIN NATRIURETIC PEPTIDE: B Natriuretic Peptide: 15 pg/mL (ref 0.0–100.0)

## 2020-04-08 LAB — FERRITIN: Ferritin: 172 ng/mL (ref 11–307)

## 2020-04-08 LAB — C-REACTIVE PROTEIN: CRP: 0.8 mg/dL (ref <1.0)

## 2020-04-08 LAB — LACTATE DEHYDROGENASE: LDH: 177 U/L (ref 98–192)

## 2020-04-08 LAB — FIBRINOGEN: Fibrinogen: 491 mg/dL — ABNORMAL HIGH (ref 210–475)

## 2020-04-08 MED ORDER — PREDNISONE 20 MG PO TABS: ORAL_TABLET | ORAL | 0 refills | Status: DC

## 2020-04-08 MED ORDER — AZITHROMYCIN 500 MG PO TABS
500.0000 mg | ORAL_TABLET | Freq: Once | ORAL | Status: AC
  Administered 2020-04-08: 500 mg via ORAL
  Filled 2020-04-08: qty 1

## 2020-04-08 MED ORDER — ALBUTEROL SULFATE HFA 108 (90 BASE) MCG/ACT IN AERS: 2.0000 | INHALATION_SPRAY | RESPIRATORY_TRACT | 0 refills | Status: DC | PRN

## 2020-04-08 MED ORDER — POTASSIUM CHLORIDE CRYS ER 20 MEQ PO TBCR
40.0000 meq | EXTENDED_RELEASE_TABLET | Freq: Once | ORAL | Status: DC
  Filled 2020-04-08: qty 2

## 2020-04-08 MED ORDER — AZITHROMYCIN 250 MG PO TABS: 250.0000 mg | ORAL_TABLET | Freq: Every day | ORAL | 0 refills | Status: DC

## 2020-04-08 MED ORDER — DEXAMETHASONE SODIUM PHOSPHATE 10 MG/ML IJ SOLN
6.0000 mg | Freq: Once | INTRAMUSCULAR | Status: AC
  Administered 2020-04-08: 6 mg via INTRAVENOUS
  Filled 2020-04-08: qty 1

## 2020-04-08 MED ORDER — SODIUM CHLORIDE 0.9 % IV BOLUS: 500.0000 mL | Freq: Once | INTRAVENOUS | Status: DC

## 2020-04-08 MED ORDER — ONDANSETRON HCL 4 MG/2ML IJ SOLN
4.0000 mg | Freq: Once | INTRAMUSCULAR | Status: AC
  Administered 2020-04-08: 4 mg via INTRAVENOUS
  Filled 2020-04-08: qty 2

## 2020-04-08 MED ORDER — KETOROLAC TROMETHAMINE 30 MG/ML IJ SOLN
10.0000 mg | Freq: Once | INTRAMUSCULAR | Status: AC
  Administered 2020-04-08: 01:00:00 9.9 mg via INTRAVENOUS
  Filled 2020-04-08: qty 1

## 2020-04-08 NOTE — Discharge Instructions (Addendum)
1.  You may continue to use the cough medicine you were already prescribed. 2.  Finish antibiotic as prescribed (Azithromycin 250 mg daily x4 days). 3.  Take Prednisone 60 mg daily x4 days. 4.  You may use Albuterol inhaler 2 puffs every 4 hours as needed for cough/difficulty breathing. 5.  Return to the ER for worsening symptoms, persistent vomiting, difficulty breathing or other concerns.

## 2020-04-08 NOTE — ED Notes (Signed)
Pt given warm blanket, lights turned off for comfort

## 2020-04-08 NOTE — ED Provider Notes (Signed)
[facility] Emergency Department Provider Note   ____________________________________________   First MD Initiated Contact with Patient 04/08/20 [phone]     (approximate)  I have reviewed the triage vital signs and the nursing notes.   HISTORY  Chief Complaint Shortness of Breath    HPI Allison Howard is a 54 y.o. female who presents to the ED from home with a chief complaint of fever, chills, cough, shortness of breath, nausea, diarrhea and upper back pain.  Symptoms onset 8 days ago after attending her son's wedding.  She was notified that several people tested Covid positive at the wedding.  Her other family members are also Covid positive.  Patient tested +2 days ago.  Denies chest pain, abdominal pain, vomiting or dysuria.       Past Medical History:  Diagnosis Date  . Anxiety   . Family history of adverse reaction to anesthesia    dad allergic to morphine, very nauseated  . Hypertension   . PONV (postoperative nausea and vomiting)     There are no problems to display for this patient.   Past Surgical History:  Procedure Laterality Date  . BUNIONECTOMY Bilateral 2010   plus hammer toes repair x 2 on each foot  . [place] 2004  . CYSTOSCOPY N/A 09/05/2015   Procedure: Abe People;  Surgeon: Benjaman Kindler, MD;  Location: [facility] ORS;  Service: Gynecology;  Laterality: N/A;  . DILITATION & CURRETTAGE/HYSTROSCOPY WITH NOVASURE ABLATION N/A 09/05/2015   Procedure: [place] WITH NOVASURE ABLATION;  Surgeon: Benjaman Kindler, MD;  Location: [facility] ORS;  Service: Gynecology;  Laterality: N/A;  . KNEE ARTHROSCOPY W/ ACL RECONSTRUCTION Right 1999  . LASIK Bilateral 2000  . SHOULDER ARTHROSCOPY WITH SUBACROMIAL DECOMPRESSION Left 2010  . SHOULDER ARTHROSCOPY WITH SUBACROMIAL DECOMPRESSION Right 2012  . TUBAL LIGATION  2004    Prior to Admission medications   Medication Sig Start  Date End Date Taking? Authorizing Provider  albuterol (VENTOLIN HFA) 108 (90 Base) MCG/ACT inhaler Inhale 2 puffs into the lungs every 4 (four) hours as needed for wheezing or shortness of breath. 04/08/20   Paulette Blanch, MD  ALPRAZolam Duanne Moron) 0.25 MG tablet Take 0.25 mg by mouth daily as needed for anxiety. 0.5 tablet    [provider]  azithromycin (ZITHROMAX) 250 MG tablet Take 1 tablet (250 mg total) by mouth daily. 04/08/20   Paulette Blanch, MD  docusate sodium (COLACE) 100 MG capsule Take 1 capsule (100 mg total) by mouth 2 (two) times daily as needed for mild constipation. 09/05/15   Benjaman Kindler, MD  ibuprofen (ADVIL,MOTRIN) 800 MG tablet Take 1 tablet (800 mg total) by mouth every 8 (eight) hours as needed for moderate pain or cramping. 09/05/15   Benjaman Kindler, MD  oxyCODONE-acetaminophen (PERCOCET/ROXICET) 5-325 MG per tablet Take 1-2 tablets by mouth every 6 (six) hours as needed. 09/05/15   Benjaman Kindler, MD  predniSONE (DELTASONE) 20 MG tablet 3 tablets daily x 4 days 04/08/20   Paulette Blanch, MD  triamterene-hydrochlorothiazide (DYAZIDE) 37.5-25 MG per capsule Take 1 capsule by mouth every morning.    [provider]    Allergies Patient has no known allergies.  Family History  Problem Relation Age of Onset  . Lung cancer Mother   . Prostate cancer Father   . Breast cancer Neg Hx     Social History Social History   Tobacco Use  . Smoking status: Never Smoker  .  Smokeless tobacco: Never Used  Substance Use Topics  . Alcohol use: Yes    Comment: 1 drink every couple of months  . Drug use: No    Review of Systems  Constitutional: Positive for fever/chills Eyes: No visual changes. ENT: No sore throat. Cardiovascular: Denies chest pain. Respiratory: Positive for cough and shortness of breath. Gastrointestinal: No abdominal pain.  Positive for nausea, no vomiting.  Positive for diarrhea.  No constipation. Genitourinary: Negative for  dysuria. Musculoskeletal: Positive for back pain. Skin: Negative for rash. Neurological: Negative for headaches, focal weakness or numbness.   ____________________________________________   PHYSICAL EXAM:  VITAL SIGNS: ED Triage Vitals  Enc Vitals Group     BP 04/07/20 2146 118/60     Pulse Rate 04/07/20 2146 74     Resp 04/07/20 2146 18     Temp 04/07/20 2146 99.6 F (37.6 C)     Temp Source 04/07/20 2146 Oral     SpO2 04/07/20 2146 97 %     Weight 04/07/20 2143 161 lb (73 kg)     Height 04/07/20 2143 5\' 4"  (1.626 m)     Head Circumference --      Peak Flow --      Pain Score 04/07/20 2143 5     Pain Loc --      Pain Edu? --      Excl. in [place]? --     Constitutional: Alert and oriented. Well appearing and in no acute distress. Eyes: Conjunctivae are normal. PERRL. EOMI. Head: Atraumatic. Nose: No congestion/rhinnorhea. Mouth/Throat: Mucous membranes are moist.   Neck: No stridor.  Supple neck without meningismus. Cardiovascular: Normal rate, regular rhythm. Grossly normal heart sounds.  Good peripheral circulation. Respiratory: Normal respiratory effort.  No retractions. Lungs CTAB. Gastrointestinal: Soft and nontender to light or deep palpation. No distention. No abdominal bruits. No CVA tenderness. Musculoskeletal: No lower extremity tenderness nor edema.  No joint effusions. Neurologic:  Normal speech and language. No gross focal neurologic deficits are appreciated. No gait instability. Skin:  Skin is warm, dry and intact. No rash noted.  No petechiae. Psychiatric: Mood and affect are normal. Speech and behavior are normal.  ____________________________________________   LABS (all labs ordered are listed, but only abnormal results are displayed)  Labs Reviewed  COMPREHENSIVE METABOLIC PANEL - Abnormal; Notable for the following components:      Result Value   Potassium 3.0 (*)    Chloride 91 (*)    CO2 35 (*)    Glucose, Bld 135 (*)    BUN 22 (*)     Creatinine, Ser 1.10 (*)    Calcium 8.6 (*)    GFR calc non Af Amer 58 (*)    All other components within normal limits  FIBRINOGEN - Abnormal; Notable for the following components:   Fibrinogen 491 (*)    All other components within normal limits  CBC WITH DIFFERENTIAL/PLATELET  BRAIN NATRIURETIC PEPTIDE  FERRITIN  LACTATE DEHYDROGENASE  PROCALCITONIN  FIBRIN DERIVATIVES D-DIMER ([facility] ONLY)  HIV ANTIBODY (ROUTINE TESTING W REFLEX)  C-REACTIVE PROTEIN  ABO/RH  ABO/RH  TROPONIN I (HIGH SENSITIVITY)   ____________________________________________  EKG  ED ECG REPORT I, Hanaa Payes J, the attending physician, personally viewed and interpreted this ECG.   Date: 04/08/2020  EKG Time: 2151  Rate: 74  Rhythm: normal EKG, normal sinus rhythm  Axis: Normal  Intervals:none  ST&T Change: Nonspecific  ____________________________________________  RADIOLOGY  ED MD interpretation: Early COVID-19 pneumonia  Official radiology report(s): DG Chest  2 View  Result Date: 04/07/2020 CLINICAL DATA:  COVID positive.  Shortness of breath EXAM: CHEST - 2 VIEW COMPARISON:  None. FINDINGS: Possible early patchy opacities peripherally in the lower lungs. Heart is normal size. No effusions. No acute bony abnormality. IMPRESSION: Patchy early peripheral infiltrates in the lower lungs bilaterally, left greater than right. Electronically Signed   By: Rolm Baptise M.D.   On: 04/07/2020 22:15    ____________________________________________   PROCEDURES  Procedure(s) performed (including Critical Care):  .1-3 Lead EKG Interpretation Performed by: Paulette Blanch, MD Authorized by: Paulette Blanch, MD     Interpretation: normal     ECG rate:  75   ECG rate assessment: normal     Rhythm: sinus rhythm     Ectopy: none     Conduction: normal   Comments:     Patient placed on cardiac monitor to monitor for arrhythmias     ____________________________________________   INITIAL IMPRESSION /  ASSESSMENT AND PLAN / ED COURSE  As part of my medical decision making, I reviewed the following data within the [place] notes reviewed and incorporated, Labs reviewed, EKG interpreted, Old chart reviewed, Radiograph reviewed and Notes from prior ED visits     ADALIZ LARAIA was evaluated in Emergency Department on 04/08/2020 for the symptoms described in the history of present illness. She was evaluated in the context of the global COVID-19 pandemic, which necessitated consideration that the patient might be at risk for infection with the SARS-CoV-2 virus that causes COVID-19. Institutional protocols and algorithms that pertain to the evaluation of patients at risk for COVID-19 are in a state of rapid change based on information released by regulatory bodies including the CDC and federal and state organizations. These policies and algorithms were followed during the patient's care in the ED.    54 year old Covid + female presenting with fever/chills, cough, shortness of breath, nausea, diarrhea and body aches.  Differential diagnosis includes but is not limited to superimposed bacterial pneumonia, sepsis, UTI, etc.  Laboratory results demonstrate mild hypokalemia.  Will check Covid inflammatory markers.  Judicious IV fluids, IV Toradol for body aches.  I have personally reviewed patient's chart and see that she was notified by her PCP Dr. Kary Kos on 4/20 one of her positive Covid test.  He also called in cough medicine which she states is helping.  Patient ambulated on room air and was not hypoxic nor tachypneic.  Desires to go home.  Awaiting inflammatory markers.  If unremarkable, anticipate discharge home.   Clinical Course as of Apr 08 424  Fri Apr 08, 2020  0237 Patient resting in no acute distress.  Inflammatory markers are unremarkable.  Will discharge home on prednisone, albuterol inhaler.  Patient already has cough medicine.  Strict return precautions given.   Patient verbalizes understanding agrees with plan of care.   [JS]    Clinical Course User Index [JS] Paulette Blanch, MD     ____________________________________________   FINAL CLINICAL IMPRESSION(S) / ED DIAGNOSES  Final diagnoses:  Pneumonia due to COVID-19 virus  Fever, unspecified fever cause     ED Discharge Orders         Ordered    albuterol (VENTOLIN HFA) 108 (90 Base) MCG/ACT inhaler  Every 4 hours PRN     04/08/20 0239    predniSONE (DELTASONE) 20 MG tablet     04/08/20 0239    azithromycin (ZITHROMAX) 250 MG tablet  Daily  04/08/20 0239           Note:  This document was prepared using Dragon voice recognition software and may include unintentional dictation errors.   Paulette Blanch, MD 04/08/20 [phone]

## 2020-04-08 NOTE — ED Notes (Signed)
Pt room air sats with ambulation 97%-100%

## 2020-04-29 ENCOUNTER — Other Ambulatory Visit: Payer: Self-pay

## 2020-05-02 ENCOUNTER — Other Ambulatory Visit: Payer: Self-pay

## 2020-05-02 ENCOUNTER — Ambulatory Visit (INDEPENDENT_AMBULATORY_CARE_PROVIDER_SITE_OTHER): Payer: 59 | Admitting: Family Medicine

## 2020-05-02 ENCOUNTER — Encounter: Payer: Self-pay | Admitting: Family Medicine

## 2020-05-02 VITALS — BP 120/64 | HR 71 | Temp 97.6°F | Wt 166.2 lb

## 2020-05-02 DIAGNOSIS — U071 COVID-19: Secondary | ICD-10-CM | POA: Diagnosis not present

## 2020-05-02 NOTE — Progress Notes (Signed)
   Subjective:    Patient ID: Allison Howard, female    DOB: 1966/09/29, 54 y.o.   MRN: [id number]  HPI Here for an intro visit and for symptoms related to a recent Covid-19 infection. She had previously seen Dr. [place] Pink at the [facility] for primary care. On 04-07-20 she tested positive for the Covid virus after a family gathering in [place] for there son's wedding. Multiple family members became ill with Covid in the week after that. She developed fever to 102 degrees, diarrhea, a dry cough, and SOB. At the ER visit that day she was treated with a Zpack, Prednisone, and an albuterol inhaler. Now she feels much better although she still has some fatigue and SOB.    Review of Systems  Constitutional: Positive for fatigue. Negative for chills, diaphoresis and fever.  Respiratory: Positive for cough and shortness of breath.   Cardiovascular: Negative.   Gastrointestinal: Negative.   Genitourinary: Negative.   Neurological: Negative.        Objective:   Physical Exam Constitutional:      Appearance: Normal appearance.  Cardiovascular:     Rate and Rhythm: Normal rate and regular rhythm.     Pulses: Normal pulses.     Heart sounds: Normal heart sounds.  Pulmonary:     Effort: Pulmonary effort is normal.     Breath sounds: Normal breath sounds.  Musculoskeletal:     Right lower leg: No edema.     Left lower leg: No edema.  Lymphadenopathy:     Cervical: No cervical adenopathy.  Neurological:     Mental Status: She is alert.           Assessment & Plan:  Intro visit for this patient who is recovering from a Covid bronchitis. She will recheck as needed. Get old records.  Alysia Penna, MD

## 2020-05-09 ENCOUNTER — Encounter: Payer: Self-pay | Admitting: Family Medicine

## 2020-05-18 NOTE — Telephone Encounter (Signed)
Noted  

## 2020-06-29 ENCOUNTER — Other Ambulatory Visit: Payer: Self-pay | Admitting: Family Medicine

## 2020-06-30 NOTE — Telephone Encounter (Signed)
Please advise. I do not see these on the patients med list.

## 2020-07-06 ENCOUNTER — Encounter (INDEPENDENT_AMBULATORY_CARE_PROVIDER_SITE_OTHER): Payer: 59 | Admitting: Ophthalmology

## 2020-07-20 ENCOUNTER — Encounter: Payer: Self-pay | Admitting: Family Medicine

## 2020-07-25 ENCOUNTER — Encounter: Payer: Self-pay | Admitting: Family Medicine

## 2020-07-25 ENCOUNTER — Ambulatory Visit (INDEPENDENT_AMBULATORY_CARE_PROVIDER_SITE_OTHER): Payer: 59 | Admitting: Family Medicine

## 2020-07-25 ENCOUNTER — Other Ambulatory Visit: Payer: Self-pay

## 2020-07-25 VITALS — BP 120/60 | Temp 98.9°F | Wt 163.2 lb

## 2020-07-25 DIAGNOSIS — L659 Nonscarring hair loss, unspecified: Secondary | ICD-10-CM

## 2020-07-25 DIAGNOSIS — R739 Hyperglycemia, unspecified: Secondary | ICD-10-CM

## 2020-07-25 DIAGNOSIS — E876 Hypokalemia: Secondary | ICD-10-CM

## 2020-07-25 DIAGNOSIS — R5383 Other fatigue: Secondary | ICD-10-CM

## 2020-07-25 MED ORDER — POTASSIUM CHLORIDE ER 10 MEQ PO CPCR: 10.0000 meq | ORAL_CAPSULE | Freq: Two times a day (BID) | ORAL | 11 refills | Status: DC

## 2020-07-25 NOTE — Progress Notes (Signed)
   Subjective:    Patient ID: Allison Howard, female    DOB: 1966-10-07, 54 y.o.   MRN: [id number]  HPI Here to go over some recent labs she had done. She was interested because over the past few months she has noticed extensive hair loss, fatigue, memory issues, and "brain fog". Of note she had a Covid-19 infection in April. These labs include a low potassium of 3.4, a normal TSH, and a fasting glucose of 122. This is interesting because her A1c was excellent at 5.4.  She saw her GYN last week and she had a TSH, a B12, and a D checked, and these were all normal.    Review of Systems  Constitutional: Positive for fatigue.  Respiratory: Negative.   Cardiovascular: Negative.   Gastrointestinal: Negative.   Endocrine: Negative.   Genitourinary: Negative.        Objective:   Physical Exam Constitutional:      Appearance: Normal appearance. She is not ill-appearing.  Cardiovascular:     Rate and Rhythm: Normal rate and regular rhythm.     Pulses: Normal pulses.     Heart sounds: Normal heart sounds.  Pulmonary:     Effort: Pulmonary effort is normal.     Breath sounds: Normal breath sounds.  Skin:    Comments: Her hair appears normal to my exam, the scalp is clear   Neurological:     Mental Status: She is alert.           Assessment & Plan:  Her complaints of hair loss, fatigue, memory trouble, and brain fog all sound like common complaints we have been hearing from patients who have had a Covid-19 infection. To make sure, we will get a full thyroid panel today. She will check her glucoses BID at home for a week and then report back to [place]. For the hypokalemia, we will increase the potassium to 20 mEq daily.  Alysia Penna, MD

## 2020-07-26 LAB — T3, FREE: T3, Free: 2.9 pg/mL (ref 2.3–4.2)

## 2020-07-26 LAB — T4, FREE: Free T4: 1.4 ng/dL (ref 0.8–1.8)

## 2020-07-26 LAB — TSH: TSH: 1.82 mIU/L

## 2020-08-05 ENCOUNTER — Other Ambulatory Visit: Payer: Self-pay

## 2020-08-05 ENCOUNTER — Encounter (INDEPENDENT_AMBULATORY_CARE_PROVIDER_SITE_OTHER): Payer: 59 | Admitting: Ophthalmology

## 2020-08-05 DIAGNOSIS — I10 Essential (primary) hypertension: Secondary | ICD-10-CM | POA: Diagnosis not present

## 2020-08-05 DIAGNOSIS — D3131 Benign neoplasm of right choroid: Secondary | ICD-10-CM | POA: Diagnosis not present

## 2020-08-05 DIAGNOSIS — H43813 Vitreous degeneration, bilateral: Secondary | ICD-10-CM

## 2020-08-05 DIAGNOSIS — H35033 Hypertensive retinopathy, bilateral: Secondary | ICD-10-CM | POA: Diagnosis not present

## 2020-09-02 HISTORY — PX: COLONOSCOPY: SHX174

## 2020-12-04 ENCOUNTER — Telehealth: Payer: 59 | Admitting: Nurse Practitioner

## 2020-12-04 DIAGNOSIS — J019 Acute sinusitis, unspecified: Secondary | ICD-10-CM | POA: Diagnosis not present

## 2020-12-04 MED ORDER — AMOXICILLIN-POT CLAVULANATE 875-125 MG PO TABS: 1.0000 | ORAL_TABLET | Freq: Two times a day (BID) | ORAL | 0 refills | Status: DC

## 2020-12-04 MED ORDER — FLUTICASONE PROPIONATE 50 MCG/ACT NA SUSP: 2.0000 | Freq: Every day | NASAL | 0 refills | Status: DC

## 2020-12-04 NOTE — Progress Notes (Signed)
We are sorry that you are not feeling well.  Here is how we plan to help!  Based on what you have shared with me it looks like you have sinusitis.  Sinusitis is inflammation and infection in the sinus cavities of the head.  Based on your presentation I believe you most likely have Acute Bacterial Sinusitis.  This is an infection caused by bacteria and is treated with antibiotics. I have prescribed Augmentin 875mg /125mg  one tablet twice daily with food, for 7 days. I am also prescribing Flonase nasal spray, use 2 sprays in each nostril daily until symptoms improve.  You may use an oral decongestant such as Mucinex D or if you have glaucoma or high blood pressure use plain Mucinex. Saline nasal spray help and can safely be used as often as needed for congestion.  If you develop worsening sinus pain, fever or notice severe headache and vision changes, or if symptoms are not better after completion of antibiotic, please schedule an appointment with a health care provider.    Sinus infections are not as easily transmitted as other respiratory infection, however we still recommend that you avoid close contact with loved ones, especially the very young and elderly.  Remember to wash your hands thoroughly throughout the day as this is the number one way to prevent the spread of infection!  Home Care:  Only take medications as instructed by your medical team.  Complete the entire course of an antibiotic.  Do not take these medications with alcohol.  A steam or ultrasonic humidifier can help congestion.  You can place a towel over your head and breathe in the steam from hot water coming from a faucet.  Avoid close contacts especially the very young and the elderly.  Cover your mouth when you cough or sneeze.  Always remember to wash your hands.  Get Help Right Away If:  You develop worsening fever or sinus pain.  You develop a severe head ache or visual changes.  Your symptoms persist after you  have completed your treatment plan.  Make sure you  Understand these instructions.  Will watch your condition.  Will get help right away if you are not doing well or get worse.  Your e-visit answers were reviewed by a board certified advanced clinical practitioner to complete your personal care plan.  Depending on the condition, your plan could have included both over the counter or prescription medications.  If there is a problem please reply  once you have received a response from your provider.  Your safety is important to [place].  If you have drug allergies check your prescription carefully.    You can use MyChart to ask questions about today's visit, request a non-urgent call back, or ask for a work or school excuse for 24 hours related to this e-Visit. If it has been greater than 24 hours you will need to follow up with your provider, or enter a new e-Visit to address those concerns.  You will get an e-mail in the next two days asking about your experience.  I hope that your e-visit has been valuable and will speed your recovery. Thank you for using e-visits.  I have spent at least 5 minutes reviewing and documenting in the patient's chart.

## 2020-12-24 ENCOUNTER — Other Ambulatory Visit: Payer: Self-pay | Admitting: Nurse Practitioner

## 2020-12-24 DIAGNOSIS — J019 Acute sinusitis, unspecified: Secondary | ICD-10-CM

## 2020-12-26 ENCOUNTER — Other Ambulatory Visit: Payer: Self-pay

## 2020-12-26 ENCOUNTER — Encounter: Payer: Self-pay | Admitting: Family Medicine

## 2020-12-26 MED ORDER — TRIAMTERENE-HCTZ 37.5-25 MG PO CAPS: 1.0000 | ORAL_CAPSULE | ORAL | 1 refills | Status: DC

## 2021-01-30 ENCOUNTER — Other Ambulatory Visit: Payer: Self-pay

## 2021-01-30 ENCOUNTER — Encounter: Payer: Self-pay | Admitting: Family Medicine

## 2021-01-30 ENCOUNTER — Ambulatory Visit (INDEPENDENT_AMBULATORY_CARE_PROVIDER_SITE_OTHER): Payer: 59 | Admitting: Family Medicine

## 2021-01-30 VITALS — BP 118/72 | HR 76 | Temp 98.6°F | Ht 64.0 in | Wt 174.6 lb

## 2021-01-30 DIAGNOSIS — Z0000 Encounter for general adult medical examination without abnormal findings: Secondary | ICD-10-CM | POA: Diagnosis not present

## 2021-01-30 LAB — CBC WITH DIFFERENTIAL/PLATELET
Basophils Absolute: 0 10*3/uL (ref 0.0–0.1)
Basophils Relative: 0.6 % (ref 0.0–3.0)
Eosinophils Absolute: 0.1 10*3/uL (ref 0.0–0.7)
Eosinophils Relative: 0.7 % (ref 0.0–5.0)
HCT: 44.1 % (ref 36.0–46.0)
Hemoglobin: 15.3 g/dL — ABNORMAL HIGH (ref 12.0–15.0)
Lymphocytes Relative: 25.2 % (ref 12.0–46.0)
Lymphs Abs: 1.8 10*3/uL (ref 0.7–4.0)
MCHC: 34.8 g/dL (ref 30.0–36.0)
MCV: 88.2 fl (ref 78.0–100.0)
Monocytes Absolute: 0.4 10*3/uL (ref 0.1–1.0)
Monocytes Relative: 5.2 % (ref 3.0–12.0)
Neutro Abs: 4.9 10*3/uL (ref 1.4–7.7)
Neutrophils Relative %: 68.3 % (ref 43.0–77.0)
Platelets: 284 10*3/uL (ref 150.0–400.0)
RBC: 4.99 Mil/uL (ref 3.87–5.11)
RDW: 13.2 % (ref 11.5–15.5)
WBC: 7.2 10*3/uL (ref 4.0–10.5)

## 2021-01-30 LAB — LIPID PANEL
Cholesterol: 256 mg/dL — ABNORMAL HIGH (ref 0–200)
HDL: 68.5 mg/dL (ref >39.00)
LDL Cholesterol: 160 mg/dL — ABNORMAL HIGH (ref 0–99)
NonHDL: 187.02
Total CHOL/HDL Ratio: 4
Triglycerides: 137 mg/dL (ref 0.0–149.0)
VLDL: 27.4 mg/dL (ref 0.0–40.0)

## 2021-01-30 LAB — HEPATIC FUNCTION PANEL
ALT: 14 U/L (ref 0–35)
AST: 15 U/L (ref 0–37)
Albumin: 4.6 g/dL (ref 3.5–5.2)
Alkaline Phosphatase: 70 U/L (ref 39–117)
Bilirubin, Direct: 0.2 mg/dL (ref 0.0–0.3)
Total Bilirubin: 1.3 mg/dL — ABNORMAL HIGH (ref 0.2–1.2)
Total Protein: 7.9 g/dL (ref 6.0–8.3)

## 2021-01-30 LAB — TSH: TSH: 2.69 u[IU]/mL (ref 0.35–4.50)

## 2021-01-30 LAB — BASIC METABOLIC PANEL
BUN: 17 mg/dL (ref 6–23)
CO2: 33 mEq/L — ABNORMAL HIGH (ref 19–32)
Calcium: 10.3 mg/dL (ref 8.4–10.5)
Chloride: 96 mEq/L (ref 96–112)
Creatinine, Ser: 1.01 mg/dL (ref 0.40–1.20)
GFR: 63.91 mL/min (ref >60.00)
Glucose, Bld: 105 mg/dL — ABNORMAL HIGH (ref 70–99)
Potassium: 3.8 mEq/L (ref 3.5–5.1)
Sodium: 139 mEq/L (ref 135–145)

## 2021-01-30 LAB — T4, FREE: Free T4: 0.92 ng/dL (ref 0.60–1.60)

## 2021-01-30 LAB — T3, FREE: T3, Free: 3.3 pg/mL (ref 2.3–4.2)

## 2021-01-30 NOTE — Progress Notes (Signed)
   Subjective:    Patient ID: Allison Howard, female    DOB: 20-Apr-1966, 55 y.o.   MRN: [id number]  HPI Here for a well exam. She feels fine.    Review of Systems  Constitutional: Negative.   HENT: Negative.   Eyes: Negative.   Respiratory: Negative.   Cardiovascular: Negative.   Gastrointestinal: Negative.   Genitourinary: Negative for decreased urine volume, difficulty urinating, dyspareunia, dysuria, enuresis, flank pain, frequency, hematuria, pelvic pain and urgency.  Musculoskeletal: Negative.   Skin: Negative.   Neurological: Negative.   Psychiatric/Behavioral: Negative.        Objective:   Physical Exam Constitutional:      General: She is not in acute distress.    Appearance: She is well-developed and well-nourished.  HENT:     Head: Normocephalic and atraumatic.     Right Ear: External ear normal.     Left Ear: External ear normal.     Nose: Nose normal.     Mouth/Throat:     Mouth: Oropharynx is clear and moist.     Pharynx: No oropharyngeal exudate.  Eyes:     General: No scleral icterus.    Extraocular Movements: EOM normal.     Conjunctiva/sclera: Conjunctivae normal.     Pupils: Pupils are equal, round, and reactive to light.  Neck:     Thyroid: No thyromegaly.     Vascular: No JVD.  Cardiovascular:     Rate and Rhythm: Normal rate and regular rhythm.     Pulses: Intact distal pulses.     Heart sounds: Normal heart sounds. No murmur heard. No friction rub. No gallop.   Pulmonary:     Effort: Pulmonary effort is normal. No respiratory distress.     Breath sounds: Normal breath sounds. No wheezing or rales.  Chest:     Chest wall: No tenderness.  Abdominal:     General: Bowel sounds are normal. There is no distension.     Palpations: Abdomen is soft. There is no mass.     Tenderness: There is no abdominal tenderness. There is no guarding or rebound.  Musculoskeletal:        General: No tenderness or edema. Normal range of motion.     Cervical  back: Normal range of motion and neck supple.  Lymphadenopathy:     Cervical: No cervical adenopathy.  Skin:    General: Skin is warm and dry.     Findings: No erythema or rash.  Neurological:     Mental Status: She is alert and oriented to person, place, and time.     Cranial Nerves: No cranial nerve deficit.     Motor: No abnormal muscle tone.     Coordination: Coordination normal.     Deep Tendon Reflexes: Reflexes are normal and symmetric. Reflexes normal.  Psychiatric:        Mood and Affect: Mood and affect normal.        Behavior: Behavior normal.        Thought Content: Thought content normal.        Judgment: Judgment normal.           Assessment & Plan:  Well exam. We discussed diet and exercise. Get fasting labs.  Alysia Penna, MD

## 2021-02-06 ENCOUNTER — Telehealth: Payer: Self-pay | Admitting: Family Medicine

## 2021-02-06 MED ORDER — PHENTERMINE HCL 37.5 MG PO CAPS: 37.5000 mg | ORAL_CAPSULE | ORAL | 0 refills | Status: DC

## 2021-02-06 NOTE — Telephone Encounter (Signed)
Her glucose is slightly above normal but not to the level of diabetes. Some refer to this as "prediabetes". The best thing to treat this is weight loss. I will send in a 90 day supply of Phentermine for her to try. Let's plan on seeing her in the office again in 90 days for an exam and also an A1c.

## 2021-02-06 NOTE — Telephone Encounter (Signed)
Patient notified VIA phone with lab results.   She has concerns of her glucose being high, ? Diabetes, also her weight keeps going up even though she is doing a low fat diet.  She wants to know if you would be willing to give her a 30-60 days supply of phentermine 37.5 mg to try to help? Please review and advise.  Thanks.  Dm/cma

## 2021-02-06 NOTE — Telephone Encounter (Signed)
Patient is calling back about lab results, please advise. CB is [phone]

## 2021-02-07 MED ORDER — PHENTERMINE HCL 15 MG PO CAPS: 15.0000 mg | ORAL_CAPSULE | ORAL | 5 refills | Status: DC

## 2021-02-07 NOTE — Telephone Encounter (Signed)
I sent in the 15 mg dose  

## 2021-02-07 NOTE — Telephone Encounter (Signed)
Patient would like to know if you can send in a lower dose of the phentermine to her pharmacy.  The 37.5 mg dose makes her jittery.   Please review and advise.  Thanks. Dm/cma

## 2021-02-07 NOTE — Telephone Encounter (Signed)
Patient aware and will go to the pharmacy to pick it up.  Dm/cma

## 2021-06-13 ENCOUNTER — Telehealth: Payer: Self-pay | Admitting: Family Medicine

## 2021-06-13 NOTE — Telephone Encounter (Signed)
Please advised if ok to send Rx

## 2021-06-13 NOTE — Telephone Encounter (Signed)
Patient is on vacation in [place] and she forgot her inhaler and was wanting to know if Dr. Sarajane Jews can send a Rx to [facility]  albuterol (VENTOLIN HFA) 108 (90 Base) MCG/ACT inhaler   [id number] - [place], [place] - [address] [place] [address] [place] Phone:  [phone]  Fax:  [phone]

## 2021-06-14 ENCOUNTER — Other Ambulatory Visit: Payer: Self-pay

## 2021-06-14 MED ORDER — ALBUTEROL SULFATE HFA 108 (90 BASE) MCG/ACT IN AERS: 2.0000 | INHALATION_SPRAY | RESPIRATORY_TRACT | 1 refills | Status: DC | PRN

## 2021-06-14 NOTE — Telephone Encounter (Signed)
Rx was sent to pt pharmacy

## 2021-06-14 NOTE — Telephone Encounter (Signed)
Patient needs inhaler today because they are supposed to go on a hike in the [facility] [place].

## 2021-06-14 NOTE — Telephone Encounter (Signed)
Please call her in one inhaler

## 2021-06-17 ENCOUNTER — Other Ambulatory Visit: Payer: Self-pay | Admitting: Family Medicine

## 2021-07-05 ENCOUNTER — Other Ambulatory Visit: Payer: Self-pay | Admitting: Family Medicine

## 2021-07-24 ENCOUNTER — Other Ambulatory Visit: Payer: Self-pay

## 2021-07-25 ENCOUNTER — Ambulatory Visit (INDEPENDENT_AMBULATORY_CARE_PROVIDER_SITE_OTHER): Payer: 59 | Admitting: Family Medicine

## 2021-07-25 ENCOUNTER — Encounter: Payer: Self-pay | Admitting: Family Medicine

## 2021-07-25 VITALS — BP 108/72 | HR 73 | Temp 98.8°F | Wt 162.0 lb

## 2021-07-25 DIAGNOSIS — E876 Hypokalemia: Secondary | ICD-10-CM

## 2021-07-25 DIAGNOSIS — E785 Hyperlipidemia, unspecified: Secondary | ICD-10-CM | POA: Insufficient documentation

## 2021-07-25 DIAGNOSIS — R0602 Shortness of breath: Secondary | ICD-10-CM

## 2021-07-25 DIAGNOSIS — I10 Essential (primary) hypertension: Secondary | ICD-10-CM | POA: Diagnosis not present

## 2021-07-25 MED ORDER — POTASSIUM CHLORIDE ER 10 MEQ PO CPCR: 20.0000 meq | ORAL_CAPSULE | Freq: Two times a day (BID) | ORAL | 3 refills | Status: DC

## 2021-07-25 NOTE — Progress Notes (Signed)
y  Subjective:    Patient ID: Allison Howard, female    DOB: 1966-08-29, 55 y.o.   MRN: [id number]  HPI Here for several issues. First she is still having trouble with SOB since she had a Covid-19 infection in April 2021. No coughing or chest pains. She is worried because her father has severe interstitial lung disease. Also she brings a copy of labs she had drawn through her work recently. Most of these were normal, but they are notable for a low potassium at 3.3 and an elevated LDL at 149. Her HDL was excellent at 72. She has made quite an effort to change her diet and she has lost 20 lbs. Her BP is stable.    Review of Systems  Constitutional:  Positive for fatigue.  Respiratory:  Positive for shortness of breath. Negative for cough and wheezing.   Cardiovascular: Negative.   Gastrointestinal: Negative.   Genitourinary: Negative.       Objective:   Physical Exam Constitutional:      Appearance: Normal appearance.  Cardiovascular:     Rate and Rhythm: Normal rate and regular rhythm.     Pulses: Normal pulses.     Heart sounds: Normal heart sounds.  Pulmonary:     Effort: Pulmonary effort is normal.     Breath sounds: Normal breath sounds.  Neurological:     Mental Status: She is alert.          Assessment & Plan:  She has SOB which has persisted since a Covid infection. We will refer her to Pulmonary to evaluate further. Her HTN is stable. We will increase her potassium to 20 mEq BID and will recheck in 3 months. She will continue to work on diet and exercise, and we will recheck lipids in 3 months.  We spent 35 minutes reviewing records and discussing these issues.  Alysia Penna, MD

## 2021-08-11 ENCOUNTER — Encounter (INDEPENDENT_AMBULATORY_CARE_PROVIDER_SITE_OTHER): Payer: 59 | Admitting: Ophthalmology

## 2021-08-24 ENCOUNTER — Ambulatory Visit (INDEPENDENT_AMBULATORY_CARE_PROVIDER_SITE_OTHER): Payer: 59 | Admitting: Dermatology

## 2021-08-24 ENCOUNTER — Other Ambulatory Visit: Payer: Self-pay

## 2021-08-24 DIAGNOSIS — D485 Neoplasm of uncertain behavior of skin: Secondary | ICD-10-CM

## 2021-08-24 DIAGNOSIS — L814 Other melanin hyperpigmentation: Secondary | ICD-10-CM

## 2021-08-24 DIAGNOSIS — Z1283 Encounter for screening for malignant neoplasm of skin: Secondary | ICD-10-CM

## 2021-08-24 DIAGNOSIS — Z872 Personal history of diseases of the skin and subcutaneous tissue: Secondary | ICD-10-CM

## 2021-08-24 DIAGNOSIS — L570 Actinic keratosis: Secondary | ICD-10-CM | POA: Diagnosis not present

## 2021-08-24 DIAGNOSIS — D239 Other benign neoplasm of skin, unspecified: Secondary | ICD-10-CM

## 2021-08-24 DIAGNOSIS — L578 Other skin changes due to chronic exposure to nonionizing radiation: Secondary | ICD-10-CM | POA: Diagnosis not present

## 2021-08-24 DIAGNOSIS — L821 Other seborrheic keratosis: Secondary | ICD-10-CM

## 2021-08-24 DIAGNOSIS — D0359 Melanoma in situ of other part of trunk: Secondary | ICD-10-CM | POA: Diagnosis not present

## 2021-08-24 DIAGNOSIS — D039 Melanoma in situ, unspecified: Secondary | ICD-10-CM

## 2021-08-24 DIAGNOSIS — D229 Melanocytic nevi, unspecified: Secondary | ICD-10-CM

## 2021-08-24 DIAGNOSIS — L650 Telogen effluvium: Secondary | ICD-10-CM | POA: Diagnosis not present

## 2021-08-24 DIAGNOSIS — D225 Melanocytic nevi of trunk: Secondary | ICD-10-CM | POA: Diagnosis not present

## 2021-08-24 HISTORY — DX: Melanoma in situ, unspecified: D03.9

## 2021-08-24 NOTE — Progress Notes (Signed)
Follow-Up Visit   Subjective  Allison Howard is a 55 y.o. female who presents for the following: Skin check (Patient presents for TBSE. She has several spots she is concerned about on her arms, legs, and face. She picks at some of the areas. She has a history of Aks treated in the past. No history of skin cancer.  ). She does get a lot of sun exposure out at the pool during the summer.  Patient also has hairloss. She had severe Covid in April 2021 and it started in May. Hair loss improved, and then started falling out again in April of this year. She uses Nioxin shampoo and conditioner. No recent surgery or illness.  All her labs have come back normal.  No family h/o of hair loss.  No new meds.or weight loss. She did not get the Covid vaccine, since she had Covid.  Patient here with husband today.  The following portions of the chart were reviewed this encounter and updated as appropriate:       Review of Systems:  No other skin or systemic complaints except as noted in HPI or Assessment and Plan.  Objective  Well appearing patient in no apparent distress; mood and affect are within normal limits.  A full examination was performed including scalp, head, eyes, ears, nose, lips, neck, chest, axillae, abdomen, back, buttocks, bilateral upper extremities, bilateral lower extremities, hands, feet, fingers, toes, fingernails, and toenails. All findings within normal limits unless otherwise noted below.  scalp hair pull with 4 hairs; short hairs at frontal/temporal scalp  R sternum x 3, L upper breast x 1, mid sternum x 1, R forearm x 4, R hand dorsum x 1, L forearm x 3, L hand dorsum x 2, R ant thigh x 1, L ant thigh x 1, R lower leg x 6, L lower leg x 3, R post thigh x 1, R med eyebrow x 2, R lat eyebrow x 1, R palm x 1 (31) Pink scaly papule of the right sternum; firm pink papule of the left upper breast; pink scaly macules and papules of the arms  Left Lower Back 5.45m two-toned brown  macule with notch       Right Spinal Lower Back 4.061mmed dark brown speckled macule    Assessment & Plan  Skin cancer screening performed today.  Actinic Damage - Severe, confluent actinic changes with pre-cancerous actinic keratoses  - Severe, chronic, not at goal, secondary to cumulative UV radiation exposure over time - diffuse scaly erythematous macules and papules with underlying dyspigmentation - Discussed Prescription "Field Treatment" for Severe, Chronic Confluent Actinic Changes with Pre-Cancerous Actinic Keratoses Field treatment involves treatment of an entire area of skin that has confluent Actinic Changes (Sun/ Ultraviolet light damage) and PreCancerous Actinic Keratoses by method of PhotoDynamic Therapy (PDT) and/or prescription Topical Chemotherapy agents such as 5-fluorouracil, 5-fluorouracil/calcipotriene, and/or imiquimod.  The purpose is to decrease the number of clinically evident and subclinical PreCancerous lesions to prevent progression to development of skin cancer by chemically destroying early precancer changes that may or may not be visible.  It has been shown to reduce the risk of developing skin cancer in the treated area. As a result of treatment, redness, scaling, crusting, and open sores may occur during treatment course. One or more than one of these methods may be used and may have to be used several times to control, suppress and eliminate the PreCancerous changes. Discussed treatment course, expected reaction, and possible side effects. -  Recommend daily broad spectrum sunscreen SPF 30+ to sun-exposed areas, reapply every 2 hours as needed.  - Staying in the shade or wearing long sleeves, sun glasses (UVA+UVB protection) and wide brim hats (4-inch brim around the entire circumference of the hat) are also recommended. - Call for new or changing lesions. -recommend PDT to chest x 2, 1 mo apart, pt may schedule  Lentigines - Scattered tan macules - Due to  sun exposure - Benign-appering, observe - Recommend daily broad spectrum sunscreen SPF 30+ to sun-exposed areas, reapply every 2 hours as needed. - Call for any changes  Seborrheic Keratoses - Stuck-on, waxy, tan-brown papules and/or plaques  - Benign-appearing - Discussed benign etiology and prognosis. - Observe - Call for any changes  Melanocytic Nevi - Tan-brown and/or pink-flesh-colored symmetric macules and papules - Benign appearing on exam today - Observation - Call clinic for new or changing moles - Recommend daily use of broad spectrum spf 30+ sunscreen to sun-exposed areas.   Dermatofibroma - Firm pink/brown papulenodule with dimple sign - Benign appearing - Call for any changes  Telogen effluvium scalp  Telogen effluvium is a benign, self limited condition causing increased hair shedding usually for several months. It does not progress to baldness. It can be triggered by recent illness, recent surgery, thyroid disease, low iron stores, vitamin D deficiency, fad diets or rapid weight loss, hormonal changes such as pregnancy or birth control pills, and some medication. Usually the hair loss starts 2-3 months after the illness or health change. Rarely, it can continue for longer than a year.  May start Rogaine 5% qd/bid to AAs. Avoid face.   Recent labs reviewed, wnl. CBC, Ferritin, TSH all nl  AK (actinic keratosis) (31) R sternum x 3, L upper breast x 1, mid sternum x 1, R forearm x 4, R hand dorsum x 1, L forearm x 3, L hand dorsum x 2, R ant thigh x 1, L ant thigh x 1, R lower leg x 6, L lower leg x 3, R post thigh x 1, R med eyebrow x 2, R lat eyebrow x 1, R palm x 1  vs ISKs  Actinic keratoses are precancerous spots that appear secondary to cumulative UV radiation exposure/sun exposure over time. They are chronic with expected duration over 1 year. A portion of actinic keratoses will progress to squamous cell carcinoma of the skin. It is not possible to reliably  predict which spots will progress to skin cancer and so treatment is recommended to prevent development of skin cancer.  Recommend daily broad spectrum sunscreen SPF 30+ to sun-exposed areas, reapply every 2 hours as needed.  Recommend staying in the shade or wearing long sleeves, sun glasses (UVA+UVB protection) and wide brim hats (4-inch brim around the entire circumference of the hat). Call for new or changing lesions.  Destruction of lesion - R sternum x 3, L upper breast x 1, mid sternum x 1, R forearm x 4, R hand dorsum x 1, L forearm x 3, L hand dorsum x 2, R ant thigh x 1, L ant thigh x 1, R lower leg x 6, L lower leg x 3, R post thigh x 1, R med eyebrow x 2, R lat eyebrow x 1, R palm x 1  Destruction method: cryotherapy   Informed consent: discussed and consent obtained   Lesion destroyed using liquid nitrogen: Yes   Region frozen until ice ball extended beyond lesion: Yes   Outcome: patient tolerated procedure well with no  complications   Post-procedure details: wound care instructions given   Additional details:  Prior to procedure, discussed risks of blister formation, small wound, skin dyspigmentation, or rare scar following cryotherapy. Recommend Vaseline ointment to treated areas while healing.   Neoplasm of uncertain behavior of skin Left Lower Back  Epidermal / dermal shaving  Lesion diameter (cm):  0.6 Informed consent: discussed and consent obtained   Patient was prepped and draped in usual sterile fashion: Area prepped with alcohol. Anesthesia: the lesion was anesthetized in a standard fashion   Anesthetic:  1% lidocaine w/ epinephrine 1-100,000 buffered w/ 8.4% NaHCO3 Instrument used: flexible razor blade   Hemostasis achieved with: pressure, aluminum chloride and electrodesiccation   Outcome: patient tolerated procedure well   Post-procedure details: wound care instructions given   Post-procedure details comment:  Ointment and small bandage applied  Specimen 1 -  Surgical pathology Differential Diagnosis: Nevus r/o Dysplasia Check Margins: Yes 5.72m two-toned brown macule with notch    Nevus Right Spinal Lower Back  Benign-appearing.  Observation.  Call clinic for new or changing moles.  Recommend daily use of broad spectrum spf 30+ sunscreen to sun-exposed areas.   Return PDT in the fall/winter, 6 months for f/u AKs.  I,Jamesetta Orleans CMA, am acting as scribe for TBrendolyn Patty MD .  Documentation: I have reviewed the above documentation for accuracy and completeness, and I agree with the above.  TBrendolyn PattyMD

## 2021-08-24 NOTE — Patient Instructions (Addendum)
Wound Care Instructions  Cleanse wound gently with soap and water once a day then pat dry with clean gauze. Apply a thing coat of Petrolatum (petroleum jelly, "Vaseline") over the wound (unless you have an allergy to this). We recommend that you use a new, sterile tube of Vaseline. Do not pick or remove scabs. Do not remove the yellow or white "healing tissue" from the base of the wound.  Cover the wound with fresh, clean, nonstick gauze and secure with paper tape. You may use Band-Aids in place of gauze and tape if the would is small enough, but would recommend trimming much of the tape off as there is often too much. Sometimes Band-Aids can irritate the skin.  You should call the office for your biopsy report after 1 week if you have not already been contacted.  If you experience any problems, such as abnormal amounts of bleeding, swelling, significant bruising, significant pain, or evidence of infection, please call the office immediately.  FOR ADULT SURGERY PATIENTS: If you need something for pain relief you may take 1 extra strength Tylenol (acetaminophen) AND 2 Ibuprofen ('200mg'$  each) together every 4 hours as needed for pain. (do not take these if you are allergic to them or if you have a reason you should not take them.) Typically, you may only need pain medication for 1 to 3 days.      Cryotherapy Aftercare  Wash gently with soap and water everyday.   Apply Vaseline and Band-Aid daily until healed.    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 It does not progress to baldness. It can be triggered by recent illness, recent surgery, thyroid disease, low iron stores, vitamin D deficiency, fad diets or rapid weight loss, hormonal changes such as pregnancy or birth control pills, and some medication. Usually the hair loss starts 2-3 months after the illness or health change. Rarely, it can continue for longer than a  year.  Recommend minoxidil 5% (Rogaine for men) solution or foam to be applied to the scalp and left in. This should ideally be used twice daily for best results but it helps with hair regrowth when used at least three times per week. Rogaine initially can cause increased hair shedding for the first few weeks but this will stop with continued use. In studies, people who used minoxidil (Rogaine) for at least 6 months had thicker hair than people who did not. Minoxidil topical (Rogaine) only works as long as it continues to be used. If if it is no longer used then the hair it has been helping to regrow can fall out. Minoxidil topical (Rogaine) can cause increased facial hair growth which can usually be managed easily with a battery-operated hair trimmer. If facial hair growth is bothersome, switching to the 2% women's version can decrease the risk of unwanted facial hair growth.   Actinic Damage - Severe, confluent actinic changes with pre-cancerous actinic keratoses  - Severe, chronic, not at goal, secondary to cumulative UV radiation exposure over time - Discussed Prescription "Field Treatment" for Severe, Chronic Confluent Actinic Changes with Pre-Cancerous Actinic Keratoses Field treatment involves treatment of an entire area of skin that has confluent Actinic Changes (Sun/ Ultraviolet light damage) and PreCancerous Actinic Keratoses by method of PhotoDynamic Therapy (PDT) and/or prescription Topical Chemotherapy agents such as 5-fluorouracil, 5-fluorouracil/calcipotriene, and/or imiquimod.  The purpose is to decrease the number of clinically evident and subclinical PreCancerous lesions to prevent progression to development of  skin cancer by chemically destroying early precancer changes that may or may not be visible.  It has been shown to reduce the risk of developing skin cancer in the treated area. As a result of treatment, redness, scaling, crusting, and open sores may occur during treatment course.  One or more than one of these methods may be used and may have to be used several times to control, suppress and eliminate the PreCancerous changes. Discussed treatment course, expected reaction, and possible side effects. - Recommend daily broad spectrum sunscreen SPF 30+ to sun-exposed areas, reapply every 2 hours as needed.  - Staying in the shade or wearing long sleeves, sun glasses (UVA+UVB protection) and wide brim hats (4-inch brim around the entire circumference of the hat) are also recommended. - Call for new or changing lesions.   If you have any questions or concerns for your doctor, please call our main line at [phone] and press option 4 to reach your doctor's medical assistant. If no one answers, please leave a voicemail as directed and we will return your call as soon as possible. Messages left after 4 pm will be answered the following business day.   You may also send [place] a message via [place]. We typically respond to MyChart messages within 1-2 business days.  For prescription refills, please ask your pharmacy to contact our office. Our fax number is [phone].  If you have an urgent issue when the clinic is closed that cannot wait until the next business day, you can page your doctor at the number below.    Please note that while we do our best to be available for urgent issues outside of office hours, we are not available 24/7.   If you have an urgent issue and are unable to reach [place], you may choose to seek medical care at your doctor's office, retail clinic, urgent care center, or emergency room.  If you have a medical emergency, please immediately call 911 or go to the emergency department.  Pager Numbers  - Dr. Nehemiah Massed: [phone]  - Dr. Laurence Ferrari: [phone]  - Dr. Nicole Kindred: [phone]  In the event of inclement weather, please call our main line at [phone] for an update on the status of any delays or closures.  Dermatology Medication Tips: Please  keep the boxes that topical medications come in in order to help keep track of the instructions about where and how to use these. Pharmacies typically print the medication instructions only on the boxes and not directly on the medication tubes.   If your medication is too expensive, please contact our office at [phone] option 4 or send [place] a message through [place].   We are unable to tell what your co-pay for medications will be in advance as this is different depending on your insurance coverage. However, we may be able to find a substitute medication at lower cost or fill out paperwork to get insurance to cover a needed medication.   If a prior authorization is required to get your medication covered by your insurance company, please allow [place] 1-2 business days to complete this process.  Drug prices often vary depending on where the prescription is filled and some pharmacies may offer cheaper prices.  The website www.goodrx.com contains coupons for medications through different pharmacies. The prices here do not account for what the cost may be with help from insurance (it may be cheaper with your insurance), but the website can give you the price if you did not use any  insurance.  - You can print the associated coupon and take it with your prescription to the pharmacy.  - You may also stop by our office during regular business hours and pick up a GoodRx coupon card.  - If you need your prescription sent electronically to a different pharmacy, notify our office through [facility] or by phone at [phone] option 4.

## 2021-08-25 ENCOUNTER — Encounter (INDEPENDENT_AMBULATORY_CARE_PROVIDER_SITE_OTHER): Payer: 59 | Admitting: Ophthalmology

## 2021-08-29 ENCOUNTER — Telehealth: Payer: Self-pay

## 2021-08-29 NOTE — Telephone Encounter (Signed)
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

## 2021-09-13 ENCOUNTER — Other Ambulatory Visit: Payer: Self-pay

## 2021-09-13 ENCOUNTER — Ambulatory Visit (INDEPENDENT_AMBULATORY_CARE_PROVIDER_SITE_OTHER): Payer: 59 | Admitting: Dermatology

## 2021-09-13 ENCOUNTER — Encounter: Payer: Self-pay | Admitting: Dermatology

## 2021-09-13 DIAGNOSIS — D0359 Melanoma in situ of other part of trunk: Secondary | ICD-10-CM | POA: Diagnosis not present

## 2021-09-13 NOTE — Patient Instructions (Signed)
Wound Care Instructions  Cleanse wound gently with soap and water once a day then pat dry with clean gauze. Apply a thing coat of Petrolatum (petroleum jelly, "Vaseline") over the wound (unless you have an allergy to this). We recommend that you use a new, sterile tube of Vaseline. Do not pick or remove scabs. Do not remove the yellow or white "healing tissue" from the base of the wound.  Cover the wound with fresh, clean, nonstick gauze and secure with paper tape. You may use Band-Aids in place of gauze and tape if the would is small enough, but would recommend trimming much of the tape off as there is often too much. Sometimes Band-Aids can irritate the skin.  You should call the office for your biopsy report after 1 week if you have not already been contacted.  If you experience any problems, such as abnormal amounts of bleeding, swelling, significant bruising, significant pain, or evidence of infection, please call the office immediately.  FOR ADULT SURGERY PATIENTS: If you need something for pain relief you may take 1 extra strength Tylenol (acetaminophen) AND 2 Ibuprofen (200mg each) together every 4 hours as needed for pain. (do not take these if you are allergic to them or if you have a reason you should not take them.) Typically, you may only need pain medication for 1 to 3 days.   If you have any questions or concerns for your doctor, please call our main line at [phone] and press option 4 to reach your doctor's medical assistant. If no one answers, please leave a voicemail as directed and we will return your call as soon as possible. Messages left after 4 pm will be answered the following business day.   You may also send us a message via MyChart. We typically respond to MyChart messages within 1-2 business days.  For prescription refills, please ask your pharmacy to contact our office. Our fax number is [phone].  If you have an urgent issue when the clinic is closed that  cannot wait until the next business day, you can page your doctor at the number below.    Please note that while we do our best to be available for urgent issues outside of office hours, we are not available 24/7.   If you have an urgent issue and are unable to reach us, you may choose to seek medical care at your doctor's office, retail clinic, urgent care center, or emergency room.  If you have a medical emergency, please immediately call 911 or go to the emergency department.  Pager Numbers  - Dr. Kowalski: [phone]  - Dr. Moye: [phone]  - Dr. Stewart: [phone]  In the event of inclement weather, please call our main line at [phone] for an update on the status of any delays or closures.  Dermatology Medication Tips: Please keep the boxes that topical medications come in in order to help keep track of the instructions about where and how to use these. Pharmacies typically print the medication instructions only on the boxes and not directly on the medication tubes.   If your medication is too expensive, please contact our office at [phone] option 4 or send us a message through MyChart.   We are unable to tell what your co-pay for medications will be in advance as this is different depending on your insurance coverage. However, we may be able to find a substitute medication at lower cost or fill out paperwork to get insurance to cover a needed   medication.   If a prior authorization is required to get your medication covered by your insurance company, please allow us 1-2 business days to complete this process.  Drug prices often vary depending on where the prescription is filled and some pharmacies may offer cheaper prices.  The website www.goodrx.com contains coupons for medications through different pharmacies. The prices here do not account for what the cost may be with help from insurance (it may be cheaper with your insurance), but the website can give you the  price if you did not use any insurance.  - You can print the associated coupon and take it with your prescription to the pharmacy.  - You may also stop by our office during regular business hours and pick up a GoodRx coupon card.  - If you need your prescription sent electronically to a different pharmacy, notify our office through [facility] MyChart or by phone at [phone] option 4.   

## 2021-09-13 NOTE — Progress Notes (Signed)
   Follow-Up Visit   Subjective  Allison Howard is a 55 y.o. female who presents for the following: Melanoma in situ (Arising in a dysplastic nevus of the left lower back. Patient presents for excision.).   The following portions of the chart were reviewed this encounter and updated as appropriate:       Review of Systems:  No other skin or systemic complaints except as noted in HPI or Assessment and Plan.  Objective  Well appearing patient in no apparent distress; mood and affect are within normal limits.  A focused examination was performed including lower back. Relevant physical exam findings are noted in the Assessment and Plan.  Left Lower Back 9.11mm Pink biopsy site.   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  Skin excision  Lesion length (cm):  0.9 Lesion width (cm):  0.9 Margin per side (cm):  0.5 Total excision diameter (cm):  1.9 Informed consent: discussed and consent obtained   Timeout: patient name, date of birth, surgical site, and procedure verified   Procedure prep:  Patient was prepped and draped in usual sterile fashion Prep type:  Povidone-iodine Anesthesia: the lesion was anesthetized in a standard fashion   Anesthesia comment:  Total  18cc -  9.0cc lido w/epi,  9.0cc bupivicaine Anesthetic:  1% lidocaine w/ epinephrine 1-100,000 buffered w/ 8.4% NaHCO3 (0.5% bupivicaine) Instrument used: #15 blade   Hemostasis achieved with: pressure and electrodesiccation   Outcome: patient tolerated procedure well with no complications    Skin repair Complexity:  Complex Final length (cm):  5.5 Informed consent: discussed and consent obtained   Timeout: patient name, date of birth, surgical site, and procedure verified   Reason for type of repair: reduce tension to allow closure, reduce the risk of dehiscence, infection, and necrosis, reduce subcutaneous dead space and avoid a hematoma, preserve normal anatomical and  functional relationships and enhance both functionality and cosmetic results   Undermining: area extensively undermined   Undermining comment:  2 cm Subcutaneous layers (deep stitches):  Suture size:  3-0 Suture type: Vicryl (polyglactin 910)   Stitches:  Buried vertical mattress Fine/surface layer approximation (top stitches):  Suture size:  3-0 Suture type: nylon   Stitches: simple interrupted   Suture removal (days):  7 Hemostasis achieved with: suture and pressure Outcome: patient tolerated procedure well with no complications   Post-procedure details: sterile dressing applied and wound care instructions given   Dressing type: pressure dressing (mupirocin)    Specimen 1 - Surgical pathology Differential Diagnosis: Melanoma in situ arising in dysplastic nevus [id number] Check Margins: No 9.68mm Pink biopsy site. 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  I, Jamesetta Orleans, CMA, am acting as scribe for Brendolyn Patty, MD .  Documentation: I have reviewed the above documentation for accuracy and completeness, and I agree with the above.  Brendolyn Patty MD

## 2021-09-14 ENCOUNTER — Telehealth: Payer: Self-pay

## 2021-09-14 NOTE — Telephone Encounter (Signed)
Pt doing fine after yesterdays surgery./sh 

## 2021-09-15 ENCOUNTER — Other Ambulatory Visit: Payer: Self-pay | Admitting: Family Medicine

## 2021-09-19 ENCOUNTER — Other Ambulatory Visit: Payer: Self-pay | Admitting: Family Medicine

## 2021-09-20 ENCOUNTER — Ambulatory Visit (INDEPENDENT_AMBULATORY_CARE_PROVIDER_SITE_OTHER): Payer: 59 | Admitting: Dermatology

## 2021-09-20 ENCOUNTER — Other Ambulatory Visit: Payer: Self-pay

## 2021-09-20 ENCOUNTER — Telehealth: Payer: Self-pay | Admitting: Family Medicine

## 2021-09-20 DIAGNOSIS — D0359 Melanoma in situ of other part of trunk: Secondary | ICD-10-CM

## 2021-09-20 DIAGNOSIS — Z4802 Encounter for removal of sutures: Secondary | ICD-10-CM

## 2021-09-20 MED ORDER — TRIAMTERENE-HCTZ 37.5-25 MG PO CAPS: 1.0000 | ORAL_CAPSULE | ORAL | 0 refills | Status: DC

## 2021-09-20 MED ORDER — PHENTERMINE HCL 15 MG PO CAPS: 15.0000 mg | ORAL_CAPSULE | ORAL | 5 refills | Status: DC

## 2021-09-20 NOTE — Telephone Encounter (Signed)
Called and spoke with patient.   Informed that medication had been sent to the pharmacy on 09/15/21, 90 day supply.    Called pharmacy on hold for 70mins, resent medication.  Nothing further is needed

## 2021-09-20 NOTE — Progress Notes (Signed)
   Follow-Up Visit   Subjective  Allison Howard is a 55 y.o. female who presents for the following: Post op (Melanoma in situ, margins free, of the left lower back.).   The following portions of the chart were reviewed this encounter and updated as appropriate:       Review of Systems:  No other skin or systemic complaints except as noted in HPI or Assessment and Plan.  Objective  Well appearing patient in no apparent distress; mood and affect are within normal limits.  A focused examination was performed including back. Relevant physical exam findings are noted in the Assessment and Plan.  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 Discussed pathology results.   Return as scheduled.  IJamesetta Orleans, CMA, am acting as scribe for Brendolyn Patty, MD .  Documentation: I have reviewed the above documentation for accuracy and completeness, and I agree with the above.  Brendolyn Patty MD

## 2021-09-20 NOTE — Patient Instructions (Signed)

## 2021-09-20 NOTE — Telephone Encounter (Signed)
Patient requesting 90 day supply. Last refill- 02/07/21--30 tabs, 5 refills Last office visit- 07/25/21  No future office visit scheduled

## 2021-09-20 NOTE — Telephone Encounter (Signed)
Patient was calling in response to refill request being denied. Patient would like a callback to discuss    Good callback number is [phone]    Please Advise

## 2021-09-21 ENCOUNTER — Telehealth: Payer: Self-pay

## 2021-09-21 MED ORDER — PHENTERMINE HCL 15 MG PO CAPS: 15.0000 mg | ORAL_CAPSULE | ORAL | 1 refills | Status: DC

## 2021-09-21 NOTE — Telephone Encounter (Addendum)
Called patient to notify that 90 day refill request has been resubmitted to the pharmacy

## 2021-09-21 NOTE — Telephone Encounter (Signed)
Spoke with patient she requested can if a 90 day supply for Phentermine 15mg  can be sent to pharmacy instead of 30 day supply, to lessen her visits to the pharmacy.

## 2021-09-21 NOTE — Telephone Encounter (Signed)
Done

## 2021-09-26 ENCOUNTER — Institutional Professional Consult (permissible substitution): Payer: 59 | Admitting: Internal Medicine

## 2021-09-27 ENCOUNTER — Ambulatory Visit: Payer: 59 | Admitting: Dermatology

## 2021-10-24 ENCOUNTER — Telehealth: Payer: Self-pay

## 2021-10-24 NOTE — Telephone Encounter (Signed)
Pt called Melanoma surgery scar is hurting, discolored and brusing around the scar.   Also pt has noticed 2 new areas that has popped up on left shin and face that may be precancerous.

## 2021-10-25 NOTE — Telephone Encounter (Signed)
Pt scheduled for recheck  Monday Nov 14 at noon

## 2021-10-26 ENCOUNTER — Other Ambulatory Visit: Payer: Self-pay

## 2021-10-26 ENCOUNTER — Ambulatory Visit (INDEPENDENT_AMBULATORY_CARE_PROVIDER_SITE_OTHER): Payer: 59 | Admitting: Dermatology

## 2021-10-26 ENCOUNTER — Encounter: Payer: Self-pay | Admitting: Dermatology

## 2021-10-26 DIAGNOSIS — S31000A Unspecified open wound of lower back and pelvis without penetration into retroperitoneum, initial encounter: Secondary | ICD-10-CM | POA: Diagnosis not present

## 2021-10-26 DIAGNOSIS — T148XXA Other injury of unspecified body region, initial encounter: Secondary | ICD-10-CM

## 2021-10-26 MED ORDER — MUPIROCIN 2 % EX OINT: 1.0000 "application " | TOPICAL_OINTMENT | Freq: Every day | CUTANEOUS | 0 refills | Status: DC

## 2021-10-26 MED ORDER — DOXYCYCLINE MONOHYDRATE 100 MG PO CAPS: 100.0000 mg | ORAL_CAPSULE | Freq: Two times a day (BID) | ORAL | 0 refills | Status: AC

## 2021-10-26 NOTE — Patient Instructions (Signed)

## 2021-10-26 NOTE — Progress Notes (Signed)
   Follow-Up Visit   Subjective  Allison Howard is a 55 y.o. female who presents for the following: Melanoma IS exc site draining (L lower back, painful).  Patient accompanied by husband who contributes to history.  The following portions of the chart were reviewed this encounter and updated as appropriate:   Tobacco  Allergies  Meds  Problems  Med Hx  Surg Hx  Fam Hx     Review of Systems:  No other skin or systemic complaints except as noted in HPI or Assessment and Plan.  Objective  Well appearing patient in no apparent distress; mood and affect are within normal limits.  A focused examination was performed including left back. Relevant physical exam findings are noted in the Assessment and Plan.  Left Lower Back Draining hematoma at melanoma IS exc site   Assessment & Plan   Hematoma - with small area of dehiscence -  At Melanoma IS excision site with some old dark blood drainage. Possibly related to vigorous activity - Zumba Left Lower Back  Start Doxycycline 100mg  1 po bid x 7 days with food and drink Start Mupirocin oint qd to excision site  doxycycline (MONODOX) 100 MG capsule - Left Lower Back Take 1 capsule (100 mg total) by mouth 2 (two) times daily for 7 days. Take with food and drink  mupirocin ointment (BACTROBAN) 2 % - Left Lower Back Apply 1 application topically daily. Qd to excision site  Return for as scheduled.  I, Othelia Pulling, RMA, am acting as scribe for Sarina Ser, MD . Documentation: I have reviewed the above documentation for accuracy and completeness, and I agree with the above.  Sarina Ser, MD

## 2021-10-30 ENCOUNTER — Other Ambulatory Visit: Payer: Self-pay

## 2021-10-30 ENCOUNTER — Ambulatory Visit (INDEPENDENT_AMBULATORY_CARE_PROVIDER_SITE_OTHER): Payer: 59 | Admitting: Dermatology

## 2021-10-30 DIAGNOSIS — T148XXA Other injury of unspecified body region, initial encounter: Secondary | ICD-10-CM

## 2021-10-30 DIAGNOSIS — C4492 Squamous cell carcinoma of skin, unspecified: Secondary | ICD-10-CM

## 2021-10-30 DIAGNOSIS — L578 Other skin changes due to chronic exposure to nonionizing radiation: Secondary | ICD-10-CM

## 2021-10-30 DIAGNOSIS — S31000A Unspecified open wound of lower back and pelvis without penetration into retroperitoneum, initial encounter: Secondary | ICD-10-CM | POA: Diagnosis not present

## 2021-10-30 DIAGNOSIS — C4402 Squamous cell carcinoma of skin of lip: Secondary | ICD-10-CM | POA: Diagnosis not present

## 2021-10-30 DIAGNOSIS — C44722 Squamous cell carcinoma of skin of right lower limb, including hip: Secondary | ICD-10-CM

## 2021-10-30 DIAGNOSIS — D485 Neoplasm of uncertain behavior of skin: Secondary | ICD-10-CM

## 2021-10-30 HISTORY — DX: Squamous cell carcinoma of skin, unspecified: C44.92

## 2021-10-30 NOTE — Patient Instructions (Addendum)
Wound Care Instructions  Cleanse wound gently with soap and water once a day then pat dry with clean gauze. Apply a thing coat of Petrolatum (petroleum jelly, "Vaseline") over the wound (unless you have an allergy to this). We recommend that you use a new, sterile tube of Vaseline. Do not pick or remove scabs. Do not remove the yellow or white "healing tissue" from the base of the wound.  Cover the wound with fresh, clean, nonstick gauze and secure with paper tape. You may use Band-Aids in place of gauze and tape if the would is small enough, but would recommend trimming much of the tape off as there is often too much. Sometimes Band-Aids can irritate the skin.  You should call the office for your biopsy report after 1 week if you have not already been contacted.  If you experience any problems, such as abnormal amounts of bleeding, swelling, significant bruising, significant pain, or evidence of infection, please call the office immediately.  FOR ADULT SURGERY PATIENTS: If you need something for pain relief you may take 1 extra strength Tylenol (acetaminophen) AND 2 Ibuprofen (200mg each) together every 4 hours as needed for pain. (do not take these if you are allergic to them or if you have a reason you should not take them.) Typically, you may only need pain medication for 1 to 3 days.   If you have any questions or concerns for your doctor, please call our main line at [phone] and press option 4 to reach your doctor's medical assistant. If no one answers, please leave a voicemail as directed and we will return your call as soon as possible. Messages left after 4 pm will be answered the following business day.   You may also send us a message via MyChart. We typically respond to MyChart messages within 1-2 business days.  For prescription refills, please ask your pharmacy to contact our office. Our fax number is [phone].  If you have an urgent issue when the clinic is closed that  cannot wait until the next business day, you can page your doctor at the number below.    Please note that while we do our best to be available for urgent issues outside of office hours, we are not available 24/7.   If you have an urgent issue and are unable to reach us, you may choose to seek medical care at your doctor's office, retail clinic, urgent care center, or emergency room.  If you have a medical emergency, please immediately call 911 or go to the emergency department.  Pager Numbers  - Dr. Kowalski: [phone]  - Dr. Moye: [phone]  - Dr. Stewart: [phone]  In the event of inclement weather, please call our main line at [phone] for an update on the status of any delays or closures.  Dermatology Medication Tips: Please keep the boxes that topical medications come in in order to help keep track of the instructions about where and how to use these. Pharmacies typically print the medication instructions only on the boxes and not directly on the medication tubes.   If your medication is too expensive, please contact our office at [phone] option 4 or send us a message through MyChart.   We are unable to tell what your co-pay for medications will be in advance as this is different depending on your insurance coverage. However, we may be able to find a substitute medication at lower cost or fill out paperwork to get insurance to cover a needed   medication.   If a prior authorization is required to get your medication covered by your insurance company, please allow us 1-2 business days to complete this process.  Drug prices often vary depending on where the prescription is filled and some pharmacies may offer cheaper prices.  The website www.goodrx.com contains coupons for medications through different pharmacies. The prices here do not account for what the cost may be with help from insurance (it may be cheaper with your insurance), but the website can give you the  price if you did not use any insurance.  - You can print the associated coupon and take it with your prescription to the pharmacy.  - You may also stop by our office during regular business hours and pick up a GoodRx coupon card.  - If you need your prescription sent electronically to a different pharmacy, notify our office through [place] [facility] MyChart or by phone at [phone] option 4.   

## 2021-10-30 NOTE — Progress Notes (Addendum)
Follow-Up Visit   Subjective  Allison Howard is a 55 y.o. female who presents for the following: Follow-up.  Patient here for follow-up to recheck excision site of the left lower back where she had the hematoma. She has a few days left of doxycycline and is using mupirocin ointment to area. Area has improved some, not as painful.   She also has a new bump on her right upper lip that came up a couple of weeks ago and is painful. Also, a spot on the left lower leg that is painful.   The following portions of the chart were reviewed this encounter and updated as appropriate:       Review of Systems:  No other skin or systemic complaints except as noted in HPI or Assessment and Plan.  Objective  Well appearing patient in no apparent distress; mood and affect are within normal limits.  A focused examination was performed including face, left lower leg, back. Relevant physical exam findings are noted in the Assessment and Plan.  Left Lower Back Excision site with two pink eroded spots, no pus or drainage at central scar  R upper lip 5.45mm pink scaly papule     R pretibia 1.0cm pink tender crusted papule L pretibia         Assessment & Plan  Hematoma Left Lower Back  Hx of Hematoma, improved, at previous Malignant Melanoma in situ site.  Continue mupirocin 2% ointment qd and cover until healed. Continue doxycycline 100mg  until finished.   Doxycycline should be taken with food to prevent nausea. Do not lay down for 30 minutes after taking. Be cautious with sun exposure and use good sun protection while on this medication. Pregnant women should not take this medication.    Related Medications doxycycline (MONODOX) 100 MG capsule Take 1 capsule (100 mg total) by mouth 2 (two) times daily for 7 days. Take with food and drink  mupirocin ointment (BACTROBAN) 2 % Apply 1 application topically daily. Qd to excision site  Neoplasm of uncertain behavior of skin (2) R  upper lip  Skin / nail biopsy Type of biopsy: tangential   Informed consent: discussed and consent obtained   Patient was prepped and draped in usual sterile fashion: Area prepped with alcohol. Anesthesia: the lesion was anesthetized in a standard fashion   Anesthetic:  1% lidocaine w/ epinephrine 1-100,000 buffered w/ 8.4% NaHCO3 Instrument used: flexible razor blade   Hemostasis achieved with: pressure, aluminum chloride and electrodesiccation   Outcome: patient tolerated procedure well   Post-procedure details: wound care instructions given   Post-procedure details comment:  Ointment and small bandage applied  Specimen 1 - Surgical pathology Differential Diagnosis: Hypertrophic AK r/o SCC Check Margins: No 5.44mm pink scaly papule  R pretibia  Skin / nail biopsy Type of biopsy: tangential   Informed consent: discussed and consent obtained   Patient was prepped and draped in usual sterile fashion: Area prepped with alcohol. Anesthesia: the lesion was anesthetized in a standard fashion   Anesthetic:  1% lidocaine w/ epinephrine 1-100,000 buffered w/ 8.4% NaHCO3 Instrument used: flexible razor blade   Hemostasis achieved with: pressure, aluminum chloride and electrodesiccation   Outcome: patient tolerated procedure well   Post-procedure details: wound care instructions given   Post-procedure details comment:  Ointment and small bandage applied  Specimen 2 - Surgical pathology Differential Diagnosis: Inflamed SK r/o SCC Check Margins: No 1.0cm pink tender crusted papule  If R upper lip +, will schedule MOHs in  [place]. If L pretibia +, will schedule EDC.  Biopsy states R pretibia in procedure note, but it is L pretibia.   Actinic Damage - chronic, secondary to cumulative UV radiation exposure/sun exposure over time - diffuse scaly erythematous macules with underlying dyspigmentation - Recommend daily broad spectrum sunscreen SPF 30+ to sun-exposed areas, reapply every  2 hours as needed.  - Recommend staying in the shade or wearing long sleeves, sun glasses (UVA+UVB protection) and wide brim hats (4-inch brim around the entire circumference of the hat). - Call for new or changing lesions.   Return as scheduled, sooner pending biopsy.  IJamesetta Orleans, CMA, am acting as scribe for Brendolyn Patty, MD . Documentation: I have reviewed the above documentation for accuracy and completeness, and I agree with the above.  Brendolyn Patty MD

## 2021-10-31 ENCOUNTER — Encounter: Payer: Self-pay | Admitting: Dermatology

## 2021-11-01 ENCOUNTER — Other Ambulatory Visit: Payer: Self-pay

## 2021-11-01 ENCOUNTER — Telehealth: Payer: Self-pay

## 2021-11-01 DIAGNOSIS — C4492 Squamous cell carcinoma of skin, unspecified: Secondary | ICD-10-CM

## 2021-11-01 NOTE — Telephone Encounter (Signed)
Advised patient of biopsy results. SCC of the R upper lip will be referred to [facility] in [place], and [facility] of the R pretibia will be treated with EDC on 12/13/21 at 2:45pm.

## 2021-11-01 NOTE — Telephone Encounter (Signed)
-----   Message from Brendolyn Patty, MD sent at 10/31/2021  7:00 PM EST ----- 1. Skin , right upper lip SQUAMOUS CELL CARCINOMA, KERATOACANTHOMA TYPE 2. Skin , right pretibia SQUAMOUS CELL CARCINOMA, KERATOACANTHOMA TYPE  1. SCC skin cancer- needs Mohs surgery in [facility] 2. SCC skin cancer- needs EDC here in office   - please call patient

## 2021-11-06 ENCOUNTER — Encounter: Payer: Self-pay | Admitting: Dermatology

## 2021-12-13 ENCOUNTER — Ambulatory Visit: Payer: 59 | Admitting: Dermatology

## 2021-12-18 ENCOUNTER — Other Ambulatory Visit: Payer: Self-pay | Admitting: Family Medicine

## 2022-01-09 ENCOUNTER — Ambulatory Visit: Payer: 59 | Admitting: Dermatology

## 2022-01-10 ENCOUNTER — Ambulatory Visit (INDEPENDENT_AMBULATORY_CARE_PROVIDER_SITE_OTHER): Payer: No Typology Code available for payment source | Admitting: Dermatology

## 2022-01-10 ENCOUNTER — Encounter: Payer: Self-pay | Admitting: Dermatology

## 2022-01-10 ENCOUNTER — Other Ambulatory Visit: Payer: Self-pay

## 2022-01-10 DIAGNOSIS — C44729 Squamous cell carcinoma of skin of left lower limb, including hip: Secondary | ICD-10-CM | POA: Diagnosis not present

## 2022-01-10 DIAGNOSIS — L570 Actinic keratosis: Secondary | ICD-10-CM

## 2022-01-10 DIAGNOSIS — Z86006 Personal history of melanoma in-situ: Secondary | ICD-10-CM | POA: Diagnosis not present

## 2022-01-10 DIAGNOSIS — L905 Scar conditions and fibrosis of skin: Secondary | ICD-10-CM | POA: Diagnosis not present

## 2022-01-10 NOTE — Progress Notes (Signed)
Follow-Up Visit   Subjective  Allison Howard is a 56 y.o. female who presents for the following: Squamous Cell Carcinoma (Left pretibia. Patient presents for [facility].) and Growth (Left post shoulder. Came up recently and patient cut it off and scab came off recently.). Still has a little of it left.   The following portions of the chart were reviewed this encounter and updated as appropriate:       Review of Systems:  No other skin or systemic complaints except as noted in HPI or Assessment and Plan.  Objective  Well appearing patient in no apparent distress; mood and affect are within normal limits.  A focused examination was performed including left lower leg. Relevant physical exam findings are noted in the Assessment and Plan.  left pretibia Pink biopsy site, appears clear with biopsy       left posterior medial shoulder Pink scaly thin papule  Right Lateral Eyebrow Residual pink/brown scaly macule  Right Upper Lip Erythematous patch. 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 Discussed EDC if patient prefers to have area treated today. Patient agrees to observe the area for now for recurrence since appears clear and well-healed.    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 They are chronic with expected duration over 1 year. A portion of actinic keratoses will progress to squamous cell carcinoma of the skin. It is not possible to reliably predict which spots will progress to skin cancer and so treatment is recommended to prevent development of skin cancer.  Recommend daily broad spectrum sunscreen SPF 30+ to sun-exposed areas, reapply every 2 hours as needed.   Recommend staying in the shade or wearing long sleeves, sun glasses (UVA+UVB protection) and wide brim hats (4-inch brim around the entire circumference of the hat). Call for new or changing lesions.  Destruction of lesion - left posterior medial shoulder  Destruction method: cryotherapy   Informed consent: discussed and consent obtained   Lesion destroyed using liquid nitrogen: Yes   Region frozen until ice ball extended beyond lesion: Yes   Outcome: patient tolerated procedure well with no complications   Post-procedure details: wound care instructions given   Additional details:  Prior to procedure, discussed risks of blister formation, small wound, skin dyspigmentation, or rare scar following cryotherapy. Recommend Vaseline ointment to treated areas while healing.   AK (actinic keratosis) Right Lateral Eyebrow  vs ISK, Residual  Actinic keratoses are precancerous spots that appear secondary to cumulative UV radiation exposure/sun exposure over time. They are chronic with expected duration over 1 year. A portion of actinic keratoses will progress to squamous cell carcinoma of the skin. It is not possible to reliably predict which spots will progress to skin cancer and so treatment is recommended to prevent development of skin cancer.  Recommend daily broad spectrum sunscreen SPF 30+ to sun-exposed areas, reapply every 2 hours as needed.  Recommend staying in the shade or wearing long sleeves, sun glasses (UVA+UVB protection) and wide brim hats (4-inch brim around the entire circumference of the hat). Call for new or changing lesions.  Destruction of lesion - Right Lateral Eyebrow  Destruction method: cryotherapy   Informed consent: discussed and consent obtained   Lesion destroyed  using liquid nitrogen: Yes   Region frozen until ice ball extended beyond lesion: Yes   Outcome: patient tolerated procedure well with no complications   Post-procedure details: wound care instructions  given   Additional details:  Prior to procedure, discussed risks of blister formation, small wound, skin dyspigmentation, or rare scar following cryotherapy. Recommend Vaseline ointment to treated areas while healing.   Scar Right Upper Lip  Healing SCC site s/p Mohs surgery 01/01/2022  Observation.  Recommend daily broad spectrum sunscreen SPF 30+ to sun-exposed areas, reapply every 2 hours as needed. Call for new or changing lesions.  Staying in the shade or wearing long sleeves, sun glasses (UVA+UVB protection) and wide brim hats (4-inch brim around the entire circumference of the hat) are also recommended for sun protection.     History of Melanoma in Situ 9/22 - No evidence of recurrence today - Recommend regular full body skin exams - Recommend daily broad spectrum sunscreen SPF 30+ to sun-exposed areas, reapply every 2 hours as needed.  - Call if any new or changing lesions are noted between office visits   Return as scheduled, for TBSE.  IJamesetta Orleans, CMA, am acting as scribe for Brendolyn Patty, MD . Documentation: I have reviewed the above documentation for accuracy and completeness, and I agree with the above.  Brendolyn Patty MD

## 2022-01-10 NOTE — Patient Instructions (Addendum)

## 2022-01-15 ENCOUNTER — Encounter: Payer: Self-pay | Admitting: Family Medicine

## 2022-01-15 NOTE — Telephone Encounter (Signed)
I am sorry but I am too full to take him right now

## 2022-01-29 ENCOUNTER — Telehealth: Payer: Self-pay

## 2022-01-29 NOTE — Telephone Encounter (Signed)
Updated specimen tracking and history from MOHs. aw 

## 2022-02-16 ENCOUNTER — Other Ambulatory Visit: Payer: Self-pay | Admitting: Family Medicine

## 2022-03-02 ENCOUNTER — Other Ambulatory Visit: Payer: Self-pay | Admitting: Obstetrics and Gynecology

## 2022-03-02 DIAGNOSIS — Z1231 Encounter for screening mammogram for malignant neoplasm of breast: Secondary | ICD-10-CM

## 2022-03-06 ENCOUNTER — Other Ambulatory Visit: Payer: Self-pay

## 2022-03-06 ENCOUNTER — Ambulatory Visit (INDEPENDENT_AMBULATORY_CARE_PROVIDER_SITE_OTHER): Payer: No Typology Code available for payment source | Admitting: Dermatology

## 2022-03-06 ENCOUNTER — Other Ambulatory Visit: Payer: Self-pay | Admitting: Dermatology

## 2022-03-06 DIAGNOSIS — Z85828 Personal history of other malignant neoplasm of skin: Secondary | ICD-10-CM

## 2022-03-06 DIAGNOSIS — D485 Neoplasm of uncertain behavior of skin: Secondary | ICD-10-CM

## 2022-03-06 DIAGNOSIS — L910 Hypertrophic scar: Secondary | ICD-10-CM

## 2022-03-06 DIAGNOSIS — Z1283 Encounter for screening for malignant neoplasm of skin: Secondary | ICD-10-CM | POA: Diagnosis not present

## 2022-03-06 DIAGNOSIS — L814 Other melanin hyperpigmentation: Secondary | ICD-10-CM

## 2022-03-06 DIAGNOSIS — L820 Inflamed seborrheic keratosis: Secondary | ICD-10-CM | POA: Diagnosis not present

## 2022-03-06 DIAGNOSIS — D2372 Other benign neoplasm of skin of left lower limb, including hip: Secondary | ICD-10-CM

## 2022-03-06 DIAGNOSIS — D225 Melanocytic nevi of trunk: Secondary | ICD-10-CM

## 2022-03-06 DIAGNOSIS — Z86006 Personal history of melanoma in-situ: Secondary | ICD-10-CM

## 2022-03-06 DIAGNOSIS — L570 Actinic keratosis: Secondary | ICD-10-CM | POA: Diagnosis not present

## 2022-03-06 DIAGNOSIS — L821 Other seborrheic keratosis: Secondary | ICD-10-CM

## 2022-03-06 DIAGNOSIS — L578 Other skin changes due to chronic exposure to nonionizing radiation: Secondary | ICD-10-CM

## 2022-03-06 DIAGNOSIS — D1800 Hemangioma unspecified site: Secondary | ICD-10-CM

## 2022-03-06 DIAGNOSIS — D229 Melanocytic nevi, unspecified: Secondary | ICD-10-CM

## 2022-03-06 NOTE — Patient Instructions (Addendum)
Recommend Serica moisturizing scar formula cream every night or [facility] brand or Mederma silicone scar sheet every night for the first year after a scar appears to help with scar remodeling if desired. Scars remodel on their own for a full year and will gradually improve in appearance over time. ? ?Cryotherapy Aftercare ? ?Wash gently with soap and water everyday.   ?Apply Vaseline and Band-Aid daily until healed.  ? ? ?Wound Care Instructions ? ?Cleanse wound gently with soap and water once a day then pat dry with clean gauze. Apply a thing coat of Petrolatum (petroleum jelly, "Vaseline") over the wound (unless you have an allergy to this). We recommend that you use a new, sterile tube of Vaseline. Do not pick or remove scabs. Do not remove the yellow or white "healing tissue" from the base of the wound. ? ?Cover the wound with fresh, clean, nonstick gauze and secure with paper tape. You may use Band-Aids in place of gauze and tape if the would is small enough, but would recommend trimming much of the tape off as there is often too much. Sometimes Band-Aids can irritate the skin. ? ?You should call the office for your biopsy report after 1 week if you have not already been contacted. ? ?If you experience any problems, such as abnormal amounts of bleeding, swelling, significant bruising, significant pain, or evidence of infection, please call the office immediately. ? ?FOR ADULT SURGERY PATIENTS: If you need something for pain relief you may take 1 extra strength Tylenol (acetaminophen) AND 2 Ibuprofen ('200mg'$  each) together every 4 hours as needed for pain. (do not take these if you are allergic to them or if you have a reason you should not take them.) Typically, you may only need pain medication for 1 to 3 days.  ? ? ? ?If You Need Anything After Your Visit ? ?If you have any questions or concerns for your doctor, please call our main line at [phone] and press option 4 to reach your doctor's medical  assistant. If no one answers, please leave a voicemail as directed and we will return your call as soon as possible. Messages left after 4 pm will be answered the following business day.  ? ?You may also send [place] a message via MyChart. We typically respond to MyChart messages within 1-2 business days. ? ?For prescription refills, please ask your pharmacy to contact our office. Our fax number is [phone]. ? ?If you have an urgent issue when the clinic is closed that cannot wait until the next business day, you can page your doctor at the number below.   ? ?Please note that while we do our best to be available for urgent issues outside of office hours, we are not available 24/7.  ? ?If you have an urgent issue and are unable to reach [place], you may choose to seek medical care at your doctor's office, retail clinic, urgent care center, or emergency room. ? ?If you have a medical emergency, please immediately call 911 or go to the emergency department. ? ?Pager Numbers ? ?- Dr. Nehemiah Massed: [phone] ? ?- Dr. Laurence Ferrari: [phone] ? ?- Dr. Nicole Kindred: [phone] ? ?In the event of inclement weather, please call our main line at [phone] for an update on the status of any delays or closures. ? ?Dermatology Medication Tips: ?Please keep the boxes that topical medications come in in order to help keep track of the instructions about where and how to use these. Pharmacies typically print the medication instructions  only on the boxes and not directly on the medication tubes.  ? ?If your medication is too expensive, please contact our office at [phone] option 4 or send [place] a message through [place].  ? ?We are unable to tell what your co-pay for medications will be in advance as this is different depending on your insurance coverage. However, we may be able to find a substitute medication at lower cost or fill out paperwork to get insurance to cover a needed medication.  ? ?If a prior authorization is required to get your  medication covered by your insurance company, please allow [place] 1-2 business days to complete this process. ? ?Drug prices often vary depending on where the prescription is filled and some pharmacies may offer cheaper prices. ? ?The website www.goodrx.com contains coupons for medications through different pharmacies. The prices here do not account for what the cost may be with help from insurance (it may be cheaper with your insurance), but the website can give you the price if you did not use any insurance.  ?- You can print the associated coupon and take it with your prescription to the pharmacy.  ?- You may also stop by our office during regular business hours and pick up a GoodRx coupon card.  ?- If you need your prescription sent electronically to a different pharmacy, notify our office through [facility] or by phone at [phone] option 4. ? ? ? ? ?Si Usted Necesita Algo Despu?s de Su Visita ? ?Tambi?n puede enviarnos un mensaje a trav?s de MyChart. Por lo general respondemos a los mensajes de MyChart en el transcurso de 1 a 2 d?as h?biles. ? ?Para renovar recetas, por favor pida a su farmacia que se ponga en contacto con nuestra oficina. Nuestro n?mero de fax es el [phone]. ? ?Si tiene un asunto urgente cuando la cl?nica est? cerrada y que no puede esperar hasta el siguiente d?a h?bil, puede llamar/localizar a su doctor(a) al n?mero que aparece a continuaci?n.  ? ?Por favor, tenga en cuenta que aunque hacemos todo lo posible para estar disponibles para asuntos urgentes fuera del horario de oficina, no estamos disponibles las 24 horas del d?a, los 7 d?as de la semana.  ? ?Si tiene un problema urgente y no puede comunicarse con nosotros, puede optar por buscar atenci?n m?dica  en el consultorio de su doctor(a), en una cl?nica privada, en un centro de atenci?n urgente o en una sala de emergencias. ? ?Si tiene Engineer, maintenance (IT) m?dica, por favor llame inmediatamente al 911 o vaya a la sala de  emergencias. ? ?N?meros de b?per ? ?- Dr. Nehemiah Massed: [phone] ? ?- Dra. Moye: [phone] ? ?- Dra. Nicole Kindred: [phone] ? ?En caso de inclemencias del tiempo, por favor llame a nuestra l?nea principal al [phone] para una actualizaci?n sobre el estado de cualquier retraso o cierre. ? ?Consejos para la medicaci?n en dermatolog?a: ?Por favor, guarde las cajas en las que vienen los medicamentos de uso t?pico para ayudarle a seguir las instrucciones sobre d?nde y c?mo usarlos. Las farmacias generalmente imprimen las instrucciones del medicamento s?lo en las cajas y no directamente en los tubos del [place].  ? ?Si su medicamento es muy caro, por favor, p?ngase en contacto con Zigmund Daniel llamando al [phone] y presione la opci?n 4 o env?enos un mensaje a trav?s de MyChart.  ? ?No podemos decirle cu?l ser? su copago por los medicamentos por adelantado ya que esto es diferente dependiendo de la cobertura de su seguro. Sin embargo, es  posible que podamos encontrar un medicamento sustituto a Electrical engineer un formulario para que el seguro cubra el medicamento que se considera necesario.  ? ?Si se requiere Ardelia Mems autorizaci?n previa para que su compa??a de seguros [place] su medicamento, por favor perm?tanos de 1 a 2 d?as h?biles para completar este proceso. ? ?Los precios de los medicamentos var?an con frecuencia dependiendo del Environmental consultant de d?nde se surte la receta y alguna farmacias pueden ofrecer precios m?s baratos. ? ?El sitio web www.goodrx.com tiene cupones para medicamentos de Airline pilot. Los precios aqu? no tienen en cuenta lo que podr?a costar con la ayuda del seguro (puede ser m?s barato con su seguro), pero el sitio web puede darle el precio si no utiliz? ning?n seguro.  ?- Puede imprimir el cup?n correspondiente y llevarlo con su receta a la farmacia.  ?- Tambi?n puede pasar por nuestra oficina durante el horario de atenci?n regular y recoger una tarjeta de cupones de GoodRx.  ?- Si  necesita que su receta se env?e electr?nicamente a Chiropodist, informe a nuestra oficina a trav?s de MyChart de [place] o por tel?fono llamando al [phone] y presione la opci?n 4. ? ?

## 2022-03-06 NOTE — Progress Notes (Signed)
? ?Follow-Up Visit ?  ?Subjective  ?Allison Howard is a 56 y.o. female who presents for the following: Follow-up. ? ?The patient presents for 6 month follow-up Total-Body Skin Exam (TBSE) for skin cancer screening and mole check.  The patient has spots, moles and lesions to be evaluated, some may be new or changing and the patient has concerns that these could be cancer. 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 Recheck hypertrophic AK of the left post med shoulder treated last visit. ? ? ?The following portions of the chart were reviewed this encounter and updated as appropriate:  ?  ?  ? ?Review of Systems:  No other skin or systemic complaints except as noted in HPI or Assessment and Plan. ? ?Objective  ?Well appearing patient in no apparent distress; mood and affect are within normal limits. ? ?A full examination was performed including scalp, head, eyes, ears, nose, lips, neck, chest, axillae, abdomen, back, buttocks, bilateral upper extremities, bilateral lower extremities, hands, feet, fingers, toes, fingernails, and toenails. All findings within normal limits unless otherwise noted below. ? ?Left Lower Back ?Well healed scar with no evidence of recurrence.  ? ?left pretibia, right upper lip ?Well healed scar with no evidence of recurrence. Hypertrophic at R upper lip ? ?R spinal lower back ?4.11m med dark brown macule  ? ?Back ?Brown macules and papules. ? ?Right Flank ?5.0 x 3.0 mm med dark brown macule ? ?Abdomen ?Brown macules and papules ? ? ? ? ? ? ? ? ? ? ? ? ? ? ? ? ?Right Upper Lip ?Hypertrophic scar ? ?L chest x 4 (4) ?Pink scaly macules ?L post medial shoulder is clear ? ?R ankle x 1, R foot dorsum x 2, R great toe x 1, R thigh x 1 (5) ?Small pink/brown scaly papules ? ?Right Anterior Axilla ?6.0 mm fleshy brown papule ? ? ? ? ? ? ?Assessment & Plan  ?Skin cancer  screening performed today. ? ?Actinic Damage ?- chronic, secondary to cumulative UV radiation exposure/sun exposure over time ?- diffuse scaly erythematous macules with underlying dyspigmentation ?- Recommend daily broad spectrum sunscreen SPF 30+ to sun-exposed areas, reapply every 2 hours as needed.  ?- Recommend staying in the shade or wearing long sleeves, sun glasses (UVA+UVB protection) and wide brim hats (4-inch brim around the entire circumference of the hat). ?- Call for new or changing lesions. ? ?Melanocytic Nevi ?- Tan-brown and/or pink-flesh-colored symmetric macules and papules ?- Benign appearing on exam today ?- Observation ?- Call clinic for new or changing moles ?- Recommend daily use of broad spectrum spf 30+ sunscreen to sun-exposed areas.  ? ?Lentigines ?- Scattered tan macules ?- Due to sun exposure ?- Benign-appering, observe ?- Recommend daily broad spectrum sunscreen SPF 30+ to sun-exposed areas, reapply every 2 hours as needed. ?- Call for any changes ? ?Seborrheic Keratoses ?- Stuck-on, waxy, tan-brown papules and/or plaques  ?- Benign-appearing ?- Discussed benign etiology and prognosis. ?- Observe ?- Call for any changes ? ?Dermatofibroma ?- Firm pink/brown papulenodule with dimple sign of the left upper thigh ?- Benign appearing ?- Call for any changes ? ?History of melanoma in situ ?Left Lower Back ? ?Clear. Observe for recurrence. Call clinic for new or changing lesions.  Recommend regular skin exams, daily broad-spectrum spf 30+ sunscreen use, and photoprotection.   ? 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 ?Left pretibia clear with biopsy (10/30/2021). Right upper lip, clear (Mohs 01/01/2022). ? ?Observe for recurrence. Call clinic for new or changing lesions.  Recommend regular skin exams, daily broad-spectrum spf 30+ sunscreen use, and photoprotection.   ? ? ? ?Nevus (4) ?Abdomen; Right Flank; Back; R spinal lower  back ? ?Benign-appearing.  Observation.  Call clinic for new or changing moles.  Recommend daily use of broad spectrum spf 30+ sunscreen to sun-exposed areas.  ? ?Hypertrophic scar ?Right Upper Lip ? ?at [facility] site s/p Mohs excision 01/01/22. Patient will continue to massage area and keep f/u with Dr Lacinda Axon to discuss steroid injections if not improving.  ? ?Recommend Serica moisturizing scar formula cream every night or [facility] brand or Mederma silicone scar sheet every night for the first year after a scar appears to help with scar remodeling if desired. Scars remodel on their own for a full year and will gradually improve in appearance over time. ? ? ?AK (actinic keratosis) (4) ?L chest x 4 ? ?Actinic keratoses are precancerous spots that appear secondary to cumulative UV radiation exposure/sun exposure over time. They are chronic with expected duration over 1 year. A portion of actinic keratoses will progress to squamous cell carcinoma of the skin. It is not possible to reliably predict which spots will progress to skin cancer and so treatment is recommended to prevent development of skin cancer. ? ?Recommend daily broad spectrum sunscreen SPF 30+ to sun-exposed areas, reapply every 2 hours as needed.  ?Recommend staying in the shade or wearing long sleeves, sun glasses (UVA+UVB protection) and wide brim hats (4-inch brim around the entire circumference of the hat). ?Call for new or changing lesions. ? ?Destruction of lesion - L chest x 4 ? ?Destruction method: cryotherapy   ?Informed consent: discussed and consent obtained   ?Lesion destroyed using liquid nitrogen: Yes   ?Region frozen until ice ball extended beyond lesion: Yes   ?Outcome: patient tolerated procedure well with no complications   ?Post-procedure details: wound care instructions given   ?Additional details:  Prior to procedure, discussed risks of blister formation, small wound, skin dyspigmentation, or rare scar following cryotherapy. Recommend  Vaseline ointment to treated areas while healing. ? ? ?Inflamed seborrheic keratosis (5) ?R ankle x 1, R foot dorsum x 2, R great toe x 1, R thigh x 1 ? ?Vrs HyAKs ? ?Destruction of lesion - R ankle x 1, R foot dorsum x 2, R great toe x 1, R thigh x 1 ? ?Destruction method: cryotherapy   ?Informed consent: discussed and consent obtained   ?Lesion destroyed using liquid nitrogen: Yes   ?Region frozen until ice ball extended beyond lesion: Yes   ?Outcome: patient tolerated procedure well with no complications   ?Post-procedure details: wound care instructions given   ?Additional details:  Prior to procedure, discussed risks of blister formation, small wound, skin dyspigmentation, or rare scar following cryotherapy. Recommend Vaseline ointment to treated areas while healing. ? ? ?Neoplasm of uncertain behavior of skin ?Right Anterior Axilla ? ?Epidermal / dermal shaving ? ?Lesion diameter (cm):  0.6 ?Informed consent: discussed and consent obtained   ?Patient was prepped and draped in usual sterile fashion: Area prepped with alcohol. ?Anesthesia: the lesion was anesthetized in a standard fashion   ?Anesthetic:  1% lidocaine w/ epinephrine 1-100,000 buffered w/ 8.4% NaHCO3 ?Instrument used: flexible razor blade   ?Hemostasis achieved with: pressure, aluminum chloride and electrodesiccation   ?  Outcome: patient tolerated procedure well   ?Post-procedure details: wound care instructions given   ?Post-procedure details comment:  Ointment and small bandage applied ? ?Specimen 1 - Surgical pathology ?Differential Diagnosis: Irritated Nevus vs other ?Check Margins: No ?6.0 mm fleshy brown papule ? ? ?Return in about 4 months (around 07/06/2022) for TBSE, Hx melanoma, Hx SCC. ? ?I, Jamesetta Orleans, CMA, am acting as scribe for Brendolyn Patty, MD . ?Documentation: I have reviewed the above documentation for accuracy and completeness, and I agree with the above. ? ?Brendolyn Patty MD  ? ? ?

## 2022-03-12 ENCOUNTER — Telehealth: Payer: Self-pay

## 2022-03-12 NOTE — Telephone Encounter (Signed)
-----   Message from Brendolyn Patty, MD sent at 03/12/2022  1:23 PM EDT ----- ?Skin , right anterior axilla ?MELANOCYTIC NEVUS, COMPOUND TYPE, BASE INVOLVED ? ?Benign mole ? ? - please call patient ?

## 2022-03-12 NOTE — Telephone Encounter (Signed)
Patient advised biopsy was benign. No further treatment needed.  ?

## 2022-03-13 ENCOUNTER — Other Ambulatory Visit: Payer: Self-pay

## 2022-03-13 ENCOUNTER — Ambulatory Visit
Admission: RE | Admit: 2022-03-13 | Discharge: 2022-03-13 | Disposition: A | Discharge status: Home / Self Care | Payer: No Typology Code available for payment source | Source: Ambulatory Visit | Attending: Obstetrics and Gynecology | Admitting: Obstetrics and Gynecology

## 2022-03-13 DIAGNOSIS — Z1231 Encounter for screening mammogram for malignant neoplasm of breast: Secondary | ICD-10-CM | POA: Insufficient documentation

## 2022-05-16 ENCOUNTER — Encounter: Payer: Self-pay | Admitting: Family Medicine

## 2022-05-17 ENCOUNTER — Other Ambulatory Visit: Payer: Self-pay

## 2022-05-17 MED ORDER — CIPROFLOXACIN HCL 500 MG PO TABS: 500.0000 mg | ORAL_TABLET | Freq: Two times a day (BID) | ORAL | 0 refills | Status: DC

## 2022-05-17 NOTE — Telephone Encounter (Signed)
Call in Cipro 500 mg BID #20

## 2022-07-03 ENCOUNTER — Other Ambulatory Visit: Payer: Self-pay | Admitting: Family Medicine

## 2022-07-04 ENCOUNTER — Other Ambulatory Visit: Payer: Self-pay | Admitting: Family Medicine

## 2022-07-10 ENCOUNTER — Ambulatory Visit: Payer: No Typology Code available for payment source | Admitting: Dermatology

## 2022-08-15 ENCOUNTER — Other Ambulatory Visit: Payer: Self-pay | Admitting: Family Medicine

## 2022-09-03 ENCOUNTER — Encounter: Payer: Self-pay | Admitting: Dermatology

## 2022-09-03 ENCOUNTER — Ambulatory Visit (INDEPENDENT_AMBULATORY_CARE_PROVIDER_SITE_OTHER): Payer: No Typology Code available for payment source | Admitting: Dermatology

## 2022-09-03 DIAGNOSIS — D229 Melanocytic nevi, unspecified: Secondary | ICD-10-CM

## 2022-09-03 DIAGNOSIS — Z85828 Personal history of other malignant neoplasm of skin: Secondary | ICD-10-CM

## 2022-09-03 DIAGNOSIS — L720 Epidermal cyst: Secondary | ICD-10-CM

## 2022-09-03 DIAGNOSIS — Z86006 Personal history of melanoma in-situ: Secondary | ICD-10-CM

## 2022-09-03 DIAGNOSIS — L821 Other seborrheic keratosis: Secondary | ICD-10-CM

## 2022-09-03 DIAGNOSIS — D225 Melanocytic nevi of trunk: Secondary | ICD-10-CM

## 2022-09-03 DIAGNOSIS — S80862A Insect bite (nonvenomous), left lower leg, initial encounter: Secondary | ICD-10-CM

## 2022-09-03 DIAGNOSIS — S80861A Insect bite (nonvenomous), right lower leg, initial encounter: Secondary | ICD-10-CM

## 2022-09-03 DIAGNOSIS — L814 Other melanin hyperpigmentation: Secondary | ICD-10-CM

## 2022-09-03 DIAGNOSIS — W57XXXA Bitten or stung by nonvenomous insect and other nonvenomous arthropods, initial encounter: Secondary | ICD-10-CM

## 2022-09-03 DIAGNOSIS — D2372 Other benign neoplasm of skin of left lower limb, including hip: Secondary | ICD-10-CM

## 2022-09-03 DIAGNOSIS — L578 Other skin changes due to chronic exposure to nonionizing radiation: Secondary | ICD-10-CM

## 2022-09-03 DIAGNOSIS — L910 Hypertrophic scar: Secondary | ICD-10-CM

## 2022-09-03 MED ORDER — MOMETASONE FUROATE 0.1 % EX CREA: TOPICAL_CREAM | CUTANEOUS | 0 refills | Status: DC

## 2022-09-03 NOTE — Patient Instructions (Addendum)
Topical steroids (such as triamcinolone, fluocinolone, fluocinonide, mometasone, clobetasol, halobetasol, betamethasone, hydrocortisone) can cause thinning and lightening of the skin if they are used for too long in the same area. Your physician has selected the right strength medicine for your problem and area affected on the body. Please use your medication only as directed by your physician to prevent side effects.    Melanoma ABCDEs  Melanoma is the most dangerous type of skin cancer, and is the leading cause of death from skin disease.  You are more likely to develop melanoma if you: Have light-colored skin, light-colored eyes, or red or blond hair Spend a lot of time in the sun Tan regularly, either outdoors or in a tanning bed Have had blistering sunburns, especially during childhood Have a close family member who has had a melanoma Have atypical moles or large birthmarks  Early detection of melanoma is key since treatment is typically straightforward and cure rates are extremely high if we catch it early.   The first sign of melanoma is often a change in a mole or a new dark spot.  The ABCDE system is a way of remembering the signs of melanoma.  A for asymmetry:  The two halves do not match. B for border:  The edges of the growth are irregular. C for color:  A mixture of colors are present instead of an even brown color. D for diameter:  Melanomas are usually (but not always) greater than 61m - the size of a pencil eraser. E for evolution:  The spot keeps changing in size, shape, and color.  Please check your skin once per month between visits. You can use a small mirror in front and a large mirror behind you to keep an eye on the back side or your body.   If you see any new or changing lesions before your next follow-up, please call to schedule a visit.  Please continue daily skin protection including broad spectrum sunscreen SPF 30+ to sun-exposed areas, reapplying every 2 hours  as needed when you're outdoors.   Staying in the shade or wearing long sleeves, sun glasses (UVA+UVB protection) and wide brim hats (4-inch brim around the entire circumference of the hat) are also recommended for sun protection.    Due to recent changes in healthcare laws, you may see results of your pathology and/or laboratory studies on MyChart before the doctors have had a chance to review them. We understand that in some cases there may be results that are confusing or concerning to you. Please understand that not all results are received at the same time and often the doctors may need to interpret multiple results in order to provide you with the best plan of care or course of treatment. Therefore, we ask that you please give u[place]2 business days to thoroughly review all your results before contacting the office for clarification. Should we see a critical lab result, you will be contacted sooner.   If You Need Anything After Your Visit  If you have any questions or concerns for your doctor, please call our main line at [phone] press option 4 to reach your doctor's medical assistant. If no one answers, please leave a voicemail as directed and we will return your call as soon as possible. Messages left after 4 pm will be answered the following business day.   You may also send [place] message via [facility] We typically respond to MyChart messages within 1-2 business days.  For prescription  refills, please ask your pharmacy to contact our office. Our fax number is [phone].  If you have an urgent issue when the clinic is closed that cannot wait until the next business day, you can page your doctor at the number below.    Please note that while we do our best to be available for urgent issues outside of office hours, we are not available 24/7.   If you have an urgent issue and are unable to reach [place], you may choose to seek medical care at your doctor's office, retail clinic, urgent care  center, or emergency room.  If you have a medical emergency, please immediately call 911 or go to the emergency department.  Pager Numbers  - Dr. Nehemiah Massed: [phone]  - Dr. Laurence Ferrari: [phone]  - Dr. Nicole Kindred: [phone]  In the event of inclement weather, please call our main line at [phone] for an update on the status of any delays or closures.  Dermatology Medication Tips: Please keep the boxes that topical medications come in in order to help keep track of the instructions about where and how to use these. Pharmacies typically print the medication instructions only on the boxes and not directly on the medication tubes.   If your medication is too expensive, please contact our office at [phone] option 4 or send [place] a message through [facility].   We are unable to tell what your co-pay for medications will be in advance as this is different depending on your insurance coverage. However, we may be able to find a substitute medication at lower cost or fill out paperwork to get insurance to cover a needed medication.   If a prior authorization is required to get your medication covered by your insurance company, please allow [place] 1-2 business days to complete this process.  Drug prices often vary depending on where the prescription is filled and some pharmacies may offer cheaper prices.  The website www.goodrx.com contains coupons for medications through different pharmacies. The prices here do not account for what the cost may be with help from insurance (it may be cheaper with your insurance), but the website can give you the price if you did not use any insurance.  - You can print the associated coupon and take it with your prescription to the pharmacy.  - You may also stop by our office during regular business hours and pick up a GoodRx coupon card.  - If you need your prescription sent electronically to a different pharmacy, notify our office through [employer] [employer] or by  phone at [phone] option 4.     Si Usted Necesita Algo Despus de Su Visita  Tambin puede enviarnos un mensaje a travs de Pharmacist, community. Por lo general respondemos a los mensajes de MyChart en el transcurso de 1 a 2 das hbiles.  Para renovar recetas, por favor pida a su farmacia que se ponga en contacto con nuestra oficina. Harland Dingwall de fax es [place] [phone].  Si tiene un asunto urgente cuando la clnica est cerrada y que no puede esperar hasta el siguiente da hbil, puede llamar/localizar a su doctor(a) al nmero que aparece a continuacin.   Por favor, tenga en cuenta que aunque hacemos todo lo posible para estar disponibles para asuntos urgentes fuera del horario de [place], no estamos disponibles las 24 horas del da, los 7 das de la [place].   Si tiene un problema urgente y no puede comunicarse con nosotros, puede optar por buscar atencin Warden/ranger  de su doctor(a), en una clnica privada, en un centro de atencin urgente o en una sala de emergencias.  Si tiene Engineering geologist, por favor llame inmediatamente al 911 o vaya a la sala de emergencias.  Nmeros de bper  - Dr. Nehemiah Massed: [phone]  - Dra. Moye: [phone]  - Dra. Nicole Kindred: [phone]  En caso de inclemencias del [place], por favor llame a Johnsie Kindred principal al [phone] para una actualizacin sobre el [place] de cualquier retraso o cierre.  Consejos para la medicacin en dermatologa: Por favor, guarde las cajas en las que vienen los medicamentos de uso tpico para ayudarle a seguir las instrucciones sobre dnde y cmo usarlos. Las farmacias generalmente imprimen las instrucciones del medicamento slo en las cajas y no directamente en los tubos del [place].   Si su medicamento es muy caro, por favor, pngase en contacto con Zigmund Daniel llamando al [phone] y presione la opcin 4 o envenos un mensaje a travs de Pharmacist, community.   No podemos decirle cul ser su copago  por los medicamentos por adelantado ya que esto es diferente dependiendo de la cobertura de su seguro. Sin embargo, es posible que podamos encontrar un medicamento sustituto a Electrical engineer un formulario para que el seguro cubra el medicamento que se considera necesario.   Si se requiere una autorizacin previa para que su compaa de seguros [place] su medicamento, por favor permtanos de 1 a 2 das hbiles para completar este proceso.  Los precios de los medicamentos varan con frecuencia dependiendo del Environmental consultant de dnde se surte la receta y alguna farmacias pueden ofrecer precios ms baratos.  El sitio web www.goodrx.com tiene cupones para medicamentos de Airline pilot. Los precios aqu no tienen en cuenta lo que podra costar con la ayuda del seguro (puede ser ms barato con su seguro), pero el sitio web puede darle el precio si no utiliz Research scientist (physical sciences).  - Puede imprimir el cupn correspondiente y llevarlo con su receta a la farmacia.  - Tambin puede pasar por nuestra oficina durante el horario de atencin regular y Charity fundraiser una tarjeta de cupones de GoodRx.  - Si necesita que su receta se enve electrnicamente a una farmacia diferente, informe a nuestra oficina a travs de MyChart de [place] o por telfono llamando al [phone] y presione la opcin 4.

## 2022-09-03 NOTE — Progress Notes (Signed)
Follow-Up Visit   Subjective  Allison Howard is a 56 y.o. female who presents for the following: Follow-up.  The patient presents for 6 month follow-up and Total-Body Skin Exam (TBSE) for skin cancer screening and mole check.  The patient has spots, moles and lesions to be evaluated, some may be new or changing. 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 She has possible bites on the lower legs, was out of town recently.    The following portions of the chart were reviewed this encounter and updated as appropriate:       Review of Systems:  No other skin or systemic complaints except as noted in HPI or Assessment and Plan.  Objective  Well appearing patient in no apparent distress; mood and affect are within normal limits.  A full examination was performed including scalp, head, eyes, ears, nose, lips, neck, chest, axillae, abdomen, back, buttocks, bilateral upper extremities, bilateral lower extremities, hands, feet, fingers, toes, fingernails, and toenails. All findings within normal limits unless otherwise noted below.  Left Lower Back Well healed scar with no evidence of recurrence.   Right Upper Lip, Left Pretibia Well healed scar with no evidence of recurrence.   back; abdomen R spinal lower back 4.58m med dark brown macule    Back Brown macules and papules. - photos compared, stable.   Right Flank 5.0 x 3.0 mm med dark brown macule   Abdomen Brown macules and papules - photos compared, stable  Left upper abdomen 3.5 mm med brown macule       L lat calf Firm, waxy, flesh tan papule -Discussed benign etiology and prognosis.   lower legs Few pink papules on the lower legs/ankles  right upper lip Thickened scar at the right upper lip    Assessment & Plan  Skin cancer screening performed today.  Actinic Damage - chronic, secondary to cumulative UV  radiation exposure/sun exposure over time - diffuse scaly erythematous macules with underlying dyspigmentation - Recommend daily broad spectrum sunscreen SPF 30+ to sun-exposed areas, reapply every 2 hours as needed.  - Recommend staying in the shade or wearing long sleeves, sun glasses (UVA+UVB protection) and wide brim hats (4-inch brim around the entire circumference of the hat). - Call for new or changing lesions.  Lentigines - Scattered tan macules - Due to sun exposure - Benign-appering, observe - Recommend daily broad spectrum sunscreen SPF 30+ to sun-exposed areas, reapply every 2 hours as needed. - Call for any changes  Milia - tiny firm white papules - type of cyst - benign - may be extracted if symptomatic - observe - Sample of effaclar gel - Apply qhs to AA.  Dermatofibroma - Firm pink/brown papulenodule with dimple sign, left upper thigh - Benign appearing - Call for any changes  Melanocytic Nevi - Tan-brown and/or pink-flesh-colored symmetric macules and papules - Benign appearing on exam today - Observation - Call clinic for new or changing moles - Recommend daily use of broad spectrum spf 30+ sunscreen to sun-exposed areas.   History of melanoma in situ Left Lower Back  Clear. Observe for recurrence. Call clinic for new or changing lesions.  Recommend regular skin exams, daily broad-spectrum spf 30+ sunscreen use, and photoprotection.    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 Right upper lip, clear (Mohs  01/01/2022).   Observe for recurrence. Call clinic for new or changing lesions.  Recommend regular skin exams, daily broad-spectrum spf 30+ sunscreen use, and photoprotection.    Nevus back; abdomen  Benign-appearing.  Stable. Observation.  Call clinic for new or changing moles.  Recommend daily use of broad spectrum spf 30+ sunscreen to sun-exposed areas.    Seborrheic keratosis L lat calf  vs Dermatofibroma  Benign appearing, observation.   Bug bite without infection, initial encounter lower legs  Start mometasone cream Apply to AA lower legs BID x 2 weeks dsp 45g 0Rf. Avoid face, groin, axilla.   Topical steroids (such as triamcinolone, fluocinolone, fluocinonide, mometasone, clobetasol, halobetasol, betamethasone, hydrocortisone) can cause thinning and lightening of the skin if they are used for too long in the same area. Your physician has selected the right strength medicine for your problem and area affected on the body. Please use your medication only as directed by your physician to prevent side effects.    mometasone (ELOCON) 0.1 % cream - lower legs Apply to bites on lower legs twice daily for up to 2 weeks. Avoid face, groin, axilla.  Hypertrophic scar right upper lip  at [facility] site s/p Mohs excision 01/01/22.   Patient was offered further treatment of scar by Mohs surgeon, but she has deferred for now. Discussed possible steroid injections.   Return in about 6 months (around 03/04/2023) for TBSE.  IJamesetta Orleans, CMA, am acting as scribe for Brendolyn Patty, MD .  Documentation: I have reviewed the above documentation for accuracy and completeness, and I agree with the above.  Brendolyn Patty MD

## 2022-09-29 ENCOUNTER — Other Ambulatory Visit: Payer: Self-pay | Admitting: Family Medicine

## 2022-09-30 ENCOUNTER — Other Ambulatory Visit: Payer: Self-pay | Admitting: Family Medicine

## 2022-10-01 NOTE — Telephone Encounter (Signed)
Pt needs to schedule appointment for further refills

## 2022-10-29 ENCOUNTER — Ambulatory Visit (INDEPENDENT_AMBULATORY_CARE_PROVIDER_SITE_OTHER): Payer: No Typology Code available for payment source | Admitting: Family Medicine

## 2022-10-29 ENCOUNTER — Encounter: Payer: Self-pay | Admitting: Family Medicine

## 2022-10-29 VITALS — BP 108/74 | HR 71 | Temp 98.4°F | Ht 64.0 in | Wt 174.5 lb

## 2022-10-29 DIAGNOSIS — Z0000 Encounter for general adult medical examination without abnormal findings: Secondary | ICD-10-CM

## 2022-10-29 NOTE — Progress Notes (Signed)
Subjective:    Patient ID: Allison Howard, female    DOB: [birth year], 56 y.o.   MRN: [id number]  HPI Here for a well exam. She has no complaints today but she asks to come off her BP medication and the Lasix. She has been seeing Dr. Arvilla Market every few months the past 2 years because she has endorsed "alternative medicine". She has been on several hormone regimens and she was on Cytomel for awhile. Now the cytomel has been stopped. She brings extensive lab work with her today, some from August and some from October. These were remarkable mostly of elevated lipids. There total cholesterol was 255, HDL 64, TG 191, and LDL 156. Otherwise CBC, BMET, liver panel,  and thyroid panel were normal. Her A1c was 5.6 on 10-12-22.    Review of Systems  Constitutional:  Positive for fatigue.  HENT: Negative.    Eyes: Negative.   Respiratory: Negative.    Cardiovascular: Negative.   Gastrointestinal: Negative.   Genitourinary:  Negative for decreased urine volume, difficulty urinating, dyspareunia, dysuria, enuresis, flank pain, frequency, hematuria, pelvic pain and urgency.  Musculoskeletal: Negative.   Skin: Negative.   Neurological: Negative.  Negative for headaches.  Psychiatric/Behavioral: Negative.         Objective:   Physical Exam Constitutional:      General: She is not in acute distress.    Appearance: Normal appearance. She is well-developed.  HENT:     Head: Normocephalic and atraumatic.     Right Ear: External ear normal.     Left Ear: External ear normal.     Nose: Nose normal.     Mouth/Throat:     Pharynx: No oropharyngeal exudate.  Eyes:     General: No scleral icterus.    Conjunctiva/sclera: Conjunctivae normal.     Pupils: Pupils are equal, round, and reactive to light.  Neck:     Thyroid: No thyromegaly.     Vascular: No JVD.  Cardiovascular:     Rate and Rhythm: Normal rate and regular rhythm.     Heart sounds: Normal heart sounds. No murmur heard.    No  friction rub. No gallop.  Pulmonary:     Effort: Pulmonary effort is normal. No respiratory distress.     Breath sounds: Normal breath sounds. No wheezing or rales.  Chest:     Chest wall: No tenderness.  Abdominal:     General: Bowel sounds are normal. There is no distension.     Palpations: Abdomen is soft. There is no mass.     Tenderness: There is no abdominal tenderness. There is no guarding or rebound.  Musculoskeletal:        General: No tenderness. Normal range of motion.     Cervical back: Normal range of motion and neck supple.  Lymphadenopathy:     Cervical: No cervical adenopathy.  Skin:    General: Skin is warm and dry.     Findings: No erythema or rash.  Neurological:     Mental Status: She is alert and oriented to person, place, and time.     Cranial Nerves: No cranial nerve deficit.     Motor: No abnormal muscle tone.     Coordination: Coordination normal.     Deep Tendon Reflexes: Reflexes are normal and symmetric. Reflexes normal.  Psychiatric:        Behavior: Behavior normal.        Thought Content: Thought content normal.  Judgment: Judgment normal.           Assessment & Plan:  Well exam. We agreed that she does not need any more labs drawn today. I advised her to get regular exercise. She will work on reducing the fats in her diet. We agreed to stop the Triamterene-HCTZ and the Furosemide. She will follow the BP at home and will report back in 4 weeks.  Allison Penna, MD

## 2022-12-30 IMAGING — MG MM DIGITAL SCREENING BILAT W/ TOMO AND CAD
8 series · 8 of 24 positions shown · non-contrast
Comparison: Previous exam(s).

CLINICAL DATA: Screening.

EXAM:
DIGITAL SCREENING BILATERAL MAMMOGRAM WITH TOMOSYNTHESIS AND CAD
TECHNIQUE: Bilateral screening digital craniocaudal and mediolateral oblique
mammograms were obtained. Bilateral screening digital breast
tomosynthesis was performed. The images were evaluated with
computer-aided detection.

[L CC synth-2D]
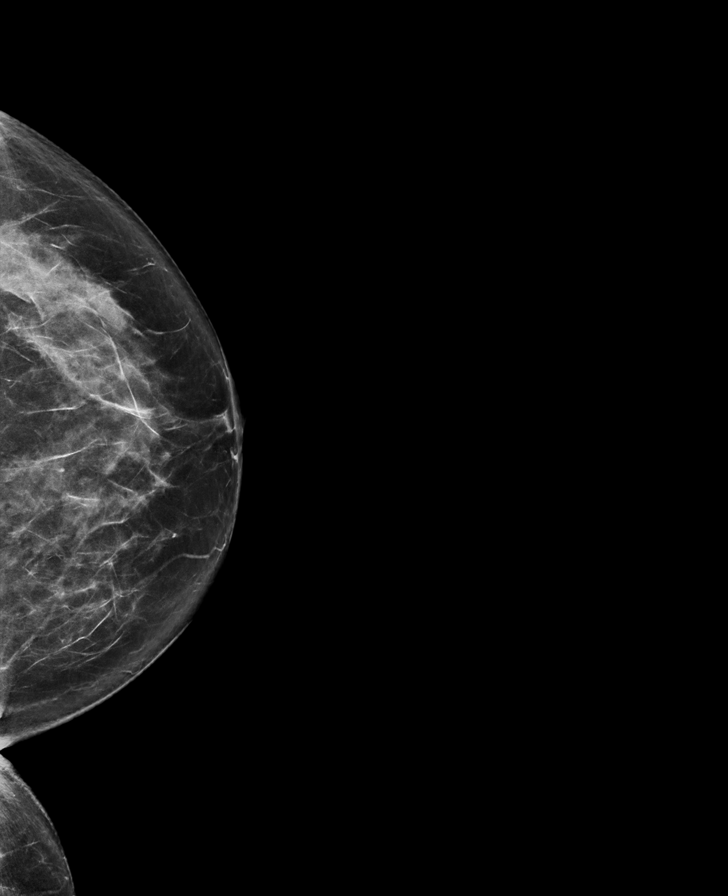

[R CC synth-2D]
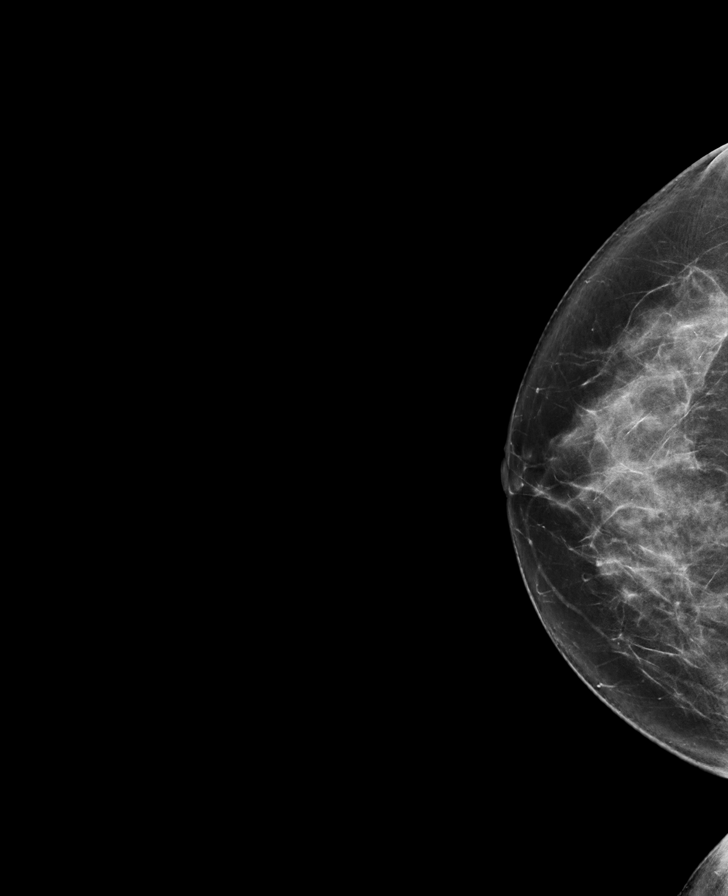

[L MLO synth-2D]
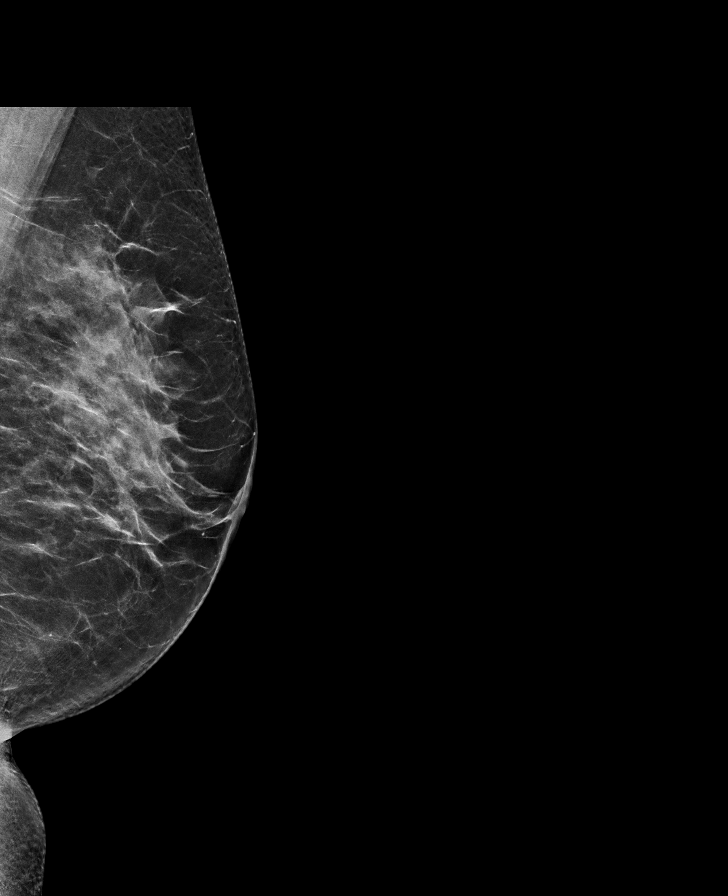

[R MLO synth-2D]
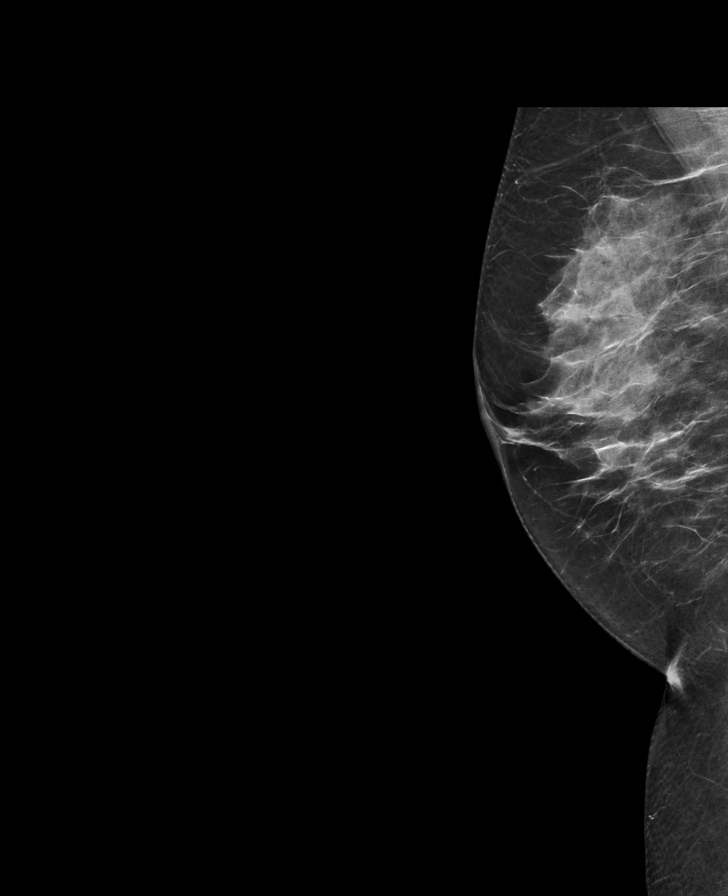

[L CC tomo · tomo slice 37/74.0]
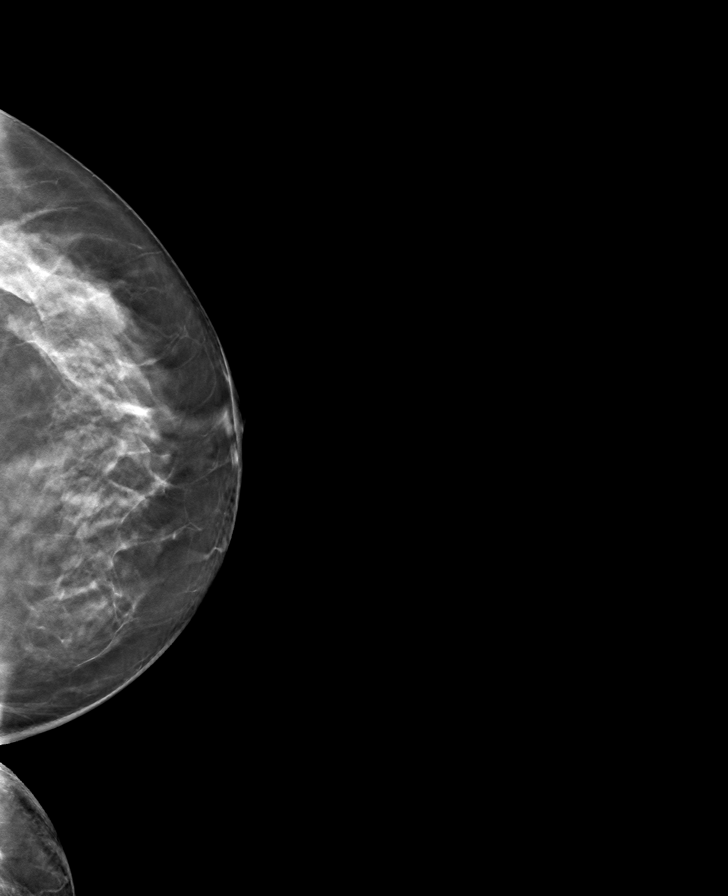

[L MLO tomo · tomo slice 34/67.0]
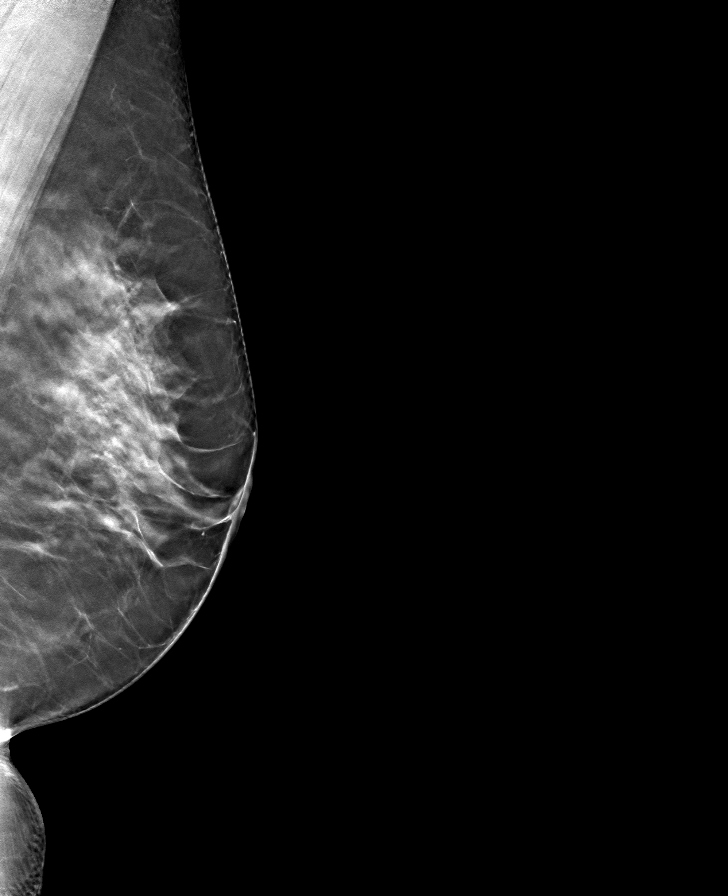

[R MLO tomo · tomo slice 37/73.0]
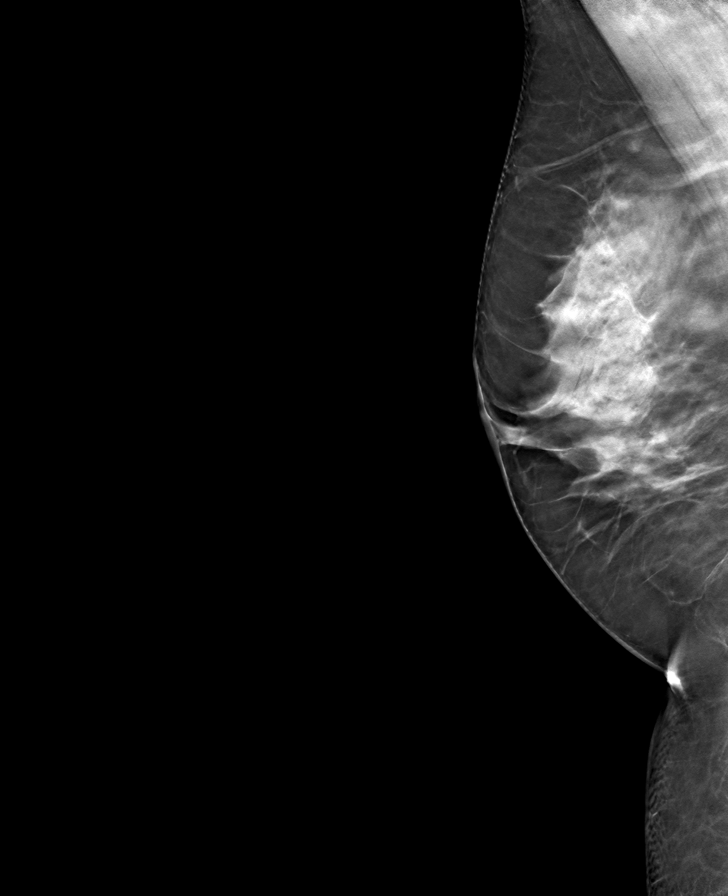

[R CC tomo · tomo slice 39/78.0]
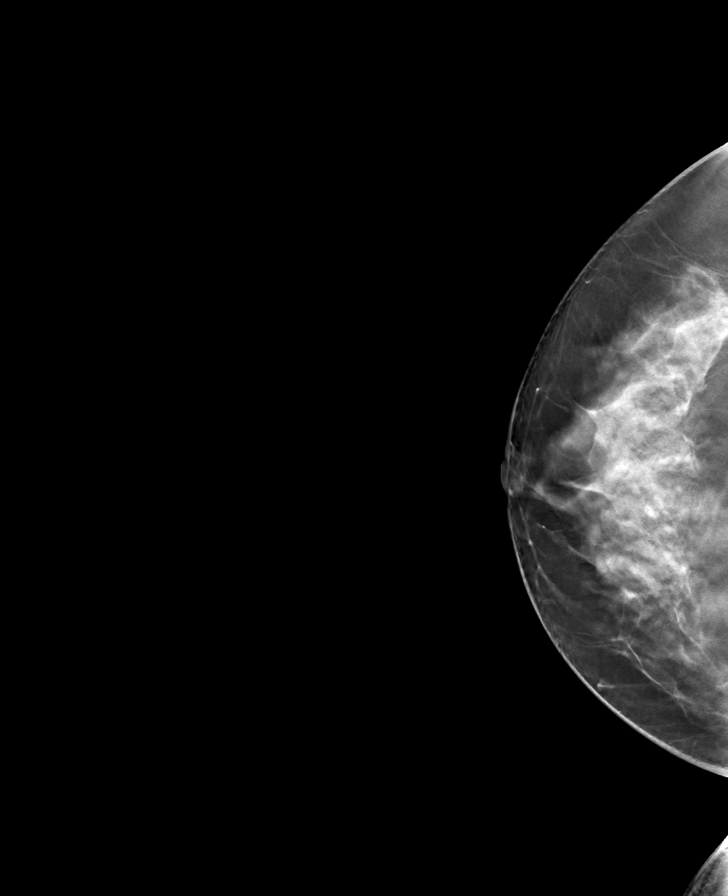

[8 of 24 positions shown; findings below may reference images not displayed]

ACR Breast Density Category c: The breast tissue is heterogeneously
dense, which may obscure small masses.
FINDINGS: There are no findings suspicious for malignancy.
IMPRESSION: No mammographic evidence of malignancy. A result letter of this
screening mammogram will be mailed directly to the patient.

RECOMMENDATION:
Screening mammogram in one year. (Code:Q3-W-BC3)

BI-RADS CATEGORY  1: Negative.

## 2023-01-02 ENCOUNTER — Other Ambulatory Visit: Payer: Self-pay | Admitting: Family Medicine

## 2023-01-03 ENCOUNTER — Other Ambulatory Visit: Payer: Self-pay | Admitting: Family Medicine

## 2023-01-04 ENCOUNTER — Telehealth: Payer: Self-pay | Admitting: Family Medicine

## 2023-01-04 MED ORDER — FUROSEMIDE 20 MG PO TABS: 20.0000 mg | ORAL_TABLET | Freq: Every day | ORAL | 3 refills | Status: DC

## 2023-01-04 MED ORDER — TRIAMTERENE-HCTZ 37.5-25 MG PO TABS: 1.0000 | ORAL_TABLET | Freq: Every day | ORAL | 3 refills | Status: DC

## 2023-01-04 NOTE — Telephone Encounter (Signed)
Requested medications not listed on med list.

## 2023-01-04 NOTE — Telephone Encounter (Signed)
Done

## 2023-01-04 NOTE — Telephone Encounter (Signed)
Pt was last seen on 10-30-2023 and she needs refill on furosemide 20 mg and triamterene/hctz   [id number] [address]: [phone] Fax: [phone]

## 2023-02-26 ENCOUNTER — Ambulatory Visit: Payer: No Typology Code available for payment source | Admitting: Dermatology

## 2023-02-26 DIAGNOSIS — D225 Melanocytic nevi of trunk: Secondary | ICD-10-CM | POA: Diagnosis not present

## 2023-02-26 DIAGNOSIS — Z1283 Encounter for screening for malignant neoplasm of skin: Secondary | ICD-10-CM

## 2023-02-26 DIAGNOSIS — L820 Inflamed seborrheic keratosis: Secondary | ICD-10-CM | POA: Diagnosis not present

## 2023-02-26 DIAGNOSIS — Z85828 Personal history of other malignant neoplasm of skin: Secondary | ICD-10-CM | POA: Diagnosis not present

## 2023-02-26 DIAGNOSIS — D229 Melanocytic nevi, unspecified: Secondary | ICD-10-CM

## 2023-02-26 DIAGNOSIS — L821 Other seborrheic keratosis: Secondary | ICD-10-CM | POA: Diagnosis not present

## 2023-02-26 DIAGNOSIS — L578 Other skin changes due to chronic exposure to nonionizing radiation: Secondary | ICD-10-CM

## 2023-02-26 DIAGNOSIS — D485 Neoplasm of uncertain behavior of skin: Secondary | ICD-10-CM

## 2023-02-26 DIAGNOSIS — Z86006 Personal history of melanoma in-situ: Secondary | ICD-10-CM

## 2023-02-26 DIAGNOSIS — D239 Other benign neoplasm of skin, unspecified: Secondary | ICD-10-CM

## 2023-02-26 DIAGNOSIS — L814 Other melanin hyperpigmentation: Secondary | ICD-10-CM

## 2023-02-26 DIAGNOSIS — L570 Actinic keratosis: Secondary | ICD-10-CM

## 2023-02-26 HISTORY — DX: Other benign neoplasm of skin, unspecified: D23.9

## 2023-02-26 NOTE — Progress Notes (Deleted)
   Follow-Up Visit   Subjective  Allison Howard is a 57 y.o. female who presents for the following: Annual Exam (The patient presents for Total-Body Skin Exam (TBSE) for skin cancer screening and mole check.  The patient has spots, moles and lesions to be evaluated, some may be new or changing and the patient has concerns that these could be cancer. ).  Patient with hx of MMis and SCC.  The following portions of the chart were reviewed this encounter and updated as appropriate:       Review of Systems:  No other skin or systemic complaints except as noted in HPI or Assessment and Plan.  Objective  Well appearing patient in no apparent distress; mood and affect are within normal limits.  A full examination was performed including scalp, head, eyes, ears, nose, lips, neck, chest, axillae, abdomen, back, buttocks, bilateral upper extremities, bilateral lower extremities, hands, feet, fingers, toes, fingernails, and toenails. All findings within normal limits unless otherwise noted below.  Abdomen, back 4.37mm med dark brown macule at right spinal lower back Brown macule and papules  left lateral calf Firm, waxy, flesh tan papule -Discussed benign etiology and prognosis.     Assessment & Plan  Nevus Abdomen, back  Seborrheic dermatitis left lateral calf  Vs Dermatofibroma   History of Melanoma in Situ at left lower back - No evidence of recurrence today - Recommend regular full body skin exams - Recommend daily broad spectrum sunscreen SPF 30+ to sun-exposed areas, reapply every 2 hours as needed.  - Call if any new or changing lesions are noted between office visits  History of Squamous Cell Carcinoma of the Skin - Left pretibia clear with biopsy (10/30/2021). Right upper lip, clear (Mohs 01/01/2022).  - No evidence of recurrence today - No lymphadenopathy - Recommend regular full body skin exams - Recommend daily broad spectrum sunscreen SPF 30+ to sun-exposed areas,  reapply every 2 hours as needed.  - Call if any new or changing lesions are noted between office visits  Lentigines - Scattered tan macules - Due to sun exposure - Benign-appearing, observe - Recommend daily broad spectrum sunscreen SPF 30+ to sun-exposed areas, reapply every 2 hours as needed. - Call for any changes  Seborrheic Keratoses - Stuck-on, waxy, tan-brown papules and/or plaques  - Benign-appearing - Discussed benign etiology and prognosis. - Observe - Call for any changes  Melanocytic Nevi - Tan-brown and/or pink-flesh-colored symmetric macules and papules - Benign appearing on exam today - Observation - Call clinic for new or changing moles - Recommend daily use of broad spectrum spf 30+ sunscreen to sun-exposed areas.   Hemangiomas - Red papules - Discussed benign nature - Observe - Call for any changes  Actinic Damage - Chronic condition, secondary to cumulative UV/sun exposure - diffuse scaly erythematous macules with underlying dyspigmentation - Recommend daily broad spectrum sunscreen SPF 30+ to sun-exposed areas, reapply every 2 hours as needed.  - Staying in the shade or wearing long sleeves, sun glasses (UVA+UVB protection) and wide brim hats (4-inch brim around the entire circumference of the hat) are also recommended for sun protection.  - Call for new or changing lesions.  Skin cancer screening performed today.  No follow-ups on file.  Graciella Belton, RMA, am acting as scribe for Brendolyn Patty, MD .

## 2023-02-26 NOTE — Progress Notes (Signed)
Follow-Up Visit   Subjective  Allison Howard is a 57 y.o. female who presents for the following: Annual Exam (The patient presents for Total-Body Skin Exam (TBSE) for skin cancer screening and mole check.  The patient has spots, moles and lesions to be evaluated, some may be new or changing and the patient has concerns that these could be cancer. ). She has some itchy spots on her hands and L ankle that she scratches at.  Hx MMis and SCC.  The following portions of the chart were reviewed this encounter and updated as appropriate:       Review of Systems:  No other skin or systemic complaints except as noted in HPI or Assessment and Plan.  Objective  Well appearing patient in no apparent distress; mood and affect are within normal limits.  A full examination was performed including scalp, head, eyes, ears, nose, lips, neck, chest, axillae, abdomen, back, buttocks, bilateral upper extremities, bilateral lower extremities, hands, feet, fingers, toes, fingernails, and toenails. All findings within normal limits unless otherwise noted below.  Abdomen, back 4.63m med dark brown macule at right spinal lower back  Brown macule and papules trunk, photos compared, no changes  Right Flank 6 x 360mbrown macule  R hand dorsum x 6, L ant ankle x 1, L med ankle x 3, L hand x 5 (15) Pink brown scaly macules and papules, firm flesh papule at L anterior ankle  Left Abdomen 4 mm speckled brown macule       R upper lip at vermillion Erythematous thin papules/macules with gritty scale.   left lateral calf Firm, waxy, flesh tan papule -Discussed benign etiology and prognosis.     Assessment & Plan  Nevus (2) Abdomen, back; Right Flank  Benign-appearing. Stable compared to previous visit. Observation.  Call clinic for new or changing moles.  Recommend daily use of broad spectrum spf 30+ sunscreen to sun-exposed areas.    Inflamed seborrheic keratosis (15) R hand dorsum x 6, L ant  ankle x 1, L med ankle x 3, L hand x 5  Symptomatic, irritating, patient would like treated.  Vs DF at left anterior ankle  Destruction of lesion - R hand dorsum x 6, L ant ankle x 1, L med ankle x 3, L hand x 5  Destruction method: cryotherapy   Informed consent: discussed and consent obtained   Lesion destroyed using liquid nitrogen: Yes   Region frozen until ice ball extended beyond lesion: Yes   Outcome: patient tolerated procedure well with no complications   Post-procedure details: wound care instructions given   Additional details:  Prior to procedure, discussed risks of blister formation, small wound, skin dyspigmentation, or rare scar following cryotherapy. Recommend Vaseline ointment to treated areas while healing.   Neoplasm of uncertain behavior of skin Left Abdomen  Epidermal / dermal shaving  Lesion diameter (cm):  0.4 Informed consent: discussed and consent obtained   Patient was prepped and draped in usual sterile fashion: area prepped with alcohol. Anesthesia: the lesion was anesthetized in a standard fashion   Anesthetic:  1% lidocaine w/ epinephrine 1-100,000 buffered w/ 8.4% NaHCO3 Instrument used: flexible razor blade   Hemostasis achieved with: pressure, aluminum chloride and electrodesiccation   Outcome: patient tolerated procedure well   Post-procedure details: wound care instructions given   Post-procedure details comment:  Ointment and small bandage applied  Specimen 1 - Surgical pathology Differential Diagnosis: Nevus r/o Dysplasia  Check Margins: No 4 mm speckled brown macule  New when compared to baseline photo  AK (actinic keratosis) R upper lip at vermillion  Actinic keratoses are precancerous spots that appear secondary to cumulative UV radiation exposure/sun exposure over time. They are chronic with expected duration over 1 year. A portion of actinic keratoses will progress to squamous cell carcinoma of the skin. It is not possible to  reliably predict which spots will progress to skin cancer and so treatment is recommended to prevent development of skin cancer.  Recommend daily broad spectrum sunscreen SPF 30+ to sun-exposed areas, reapply every 2 hours as needed.  Recommend staying in the shade or wearing long sleeves, sun glasses (UVA+UVB protection) and wide brim hats (4-inch brim around the entire circumference of the hat). Call for new or changing lesions.  Destruction of lesion - R upper lip at vermillion  Destruction method: cryotherapy   Informed consent: discussed and consent obtained   Lesion destroyed using liquid nitrogen: Yes   Region frozen until ice ball extended beyond lesion: Yes   Outcome: patient tolerated procedure well with no complications   Post-procedure details: wound care instructions given   Additional details:  Prior to procedure, discussed risks of blister formation, small wound, skin dyspigmentation, or rare scar following cryotherapy. Recommend Vaseline ointment to treated areas while healing.   Seborrheic keratosis left lateral calf  Vs Dermatofibroma  Benign, observe.    History of Squamous Cell Carcinoma of the Skin - Left pretibia clear with biopsy (10/30/2021). Right upper lip, clear (Mohs 01/01/2022  - No evidence of recurrence today - Recommend regular full body skin exams - Recommend daily broad spectrum sunscreen SPF 30+ to sun-exposed areas, reapply every 2 hours as needed.  - Call if any new or changing lesions are noted between office visits  History of Melanoma in Situ- 08/2021 - No evidence of recurrence today at left lower back - Recommend regular full body skin exams - Recommend daily broad spectrum sunscreen SPF 30+ to sun-exposed areas, reapply every 2 hours as needed.  - Call if any new or changing lesions are noted between office visits  Actinic Damage - chronic, secondary to cumulative UV radiation exposure/sun exposure over time - diffuse scaly erythematous  macules with underlying dyspigmentation - Recommend daily broad spectrum sunscreen SPF 30+ to sun-exposed areas, reapply every 2 hours as needed.  - Recommend staying in the shade or wearing long sleeves, sun glasses (UVA+UVB protection) and wide brim hats (4-inch brim around the entire circumference of the hat). - Call for new or changing lesions.   Lentigines - Scattered tan macules - Due to sun exposure - Benign-appearing, observe - Recommend daily broad spectrum sunscreen SPF 30+ to sun-exposed areas, reapply every 2 hours as needed. - Call for any changes   Seborrheic Keratoses - Stuck-on, waxy, tan-brown papules and/or plaques  - Benign-appearing - Discussed benign etiology and prognosis. - Observe - Call for any changes  Return in about 6 months (around 08/29/2023) for TBSE, Hx MMis, Hx SCC.  Graciella Belton, RMA, am acting as scribe for Brendolyn Patty, MD .  Documentation: I have reviewed the above documentation for accuracy and completeness, and I agree with the above.  Brendolyn Patty MD

## 2023-02-26 NOTE — Patient Instructions (Signed)
Cryotherapy Aftercare  Wash gently with soap and water everyday.   Apply Vaseline and Band-Aid daily until healed.   Wound Care Instructions  Cleanse wound gently with soap and water once a day then pat dry with clean gauze. Apply a thin coat of Petrolatum (petroleum jelly, "Vaseline") over the wound (unless you have an allergy to this). We recommend that you use a new, sterile tube of Vaseline. Do not pick or remove scabs. Do not remove the yellow or white "healing tissue" from the base of the wound.  Cover the wound with fresh, clean, nonstick gauze and secure with paper tape. You may use Band-Aids in place of gauze and tape if the wound is small enough, but would recommend trimming much of the tape off as there is often too much. Sometimes Band-Aids can irritate the skin.  You should call the office for your biopsy report after 1 week if you have not already been contacted.  If you experience any problems, such as abnormal amounts of bleeding, swelling, significant bruising, significant pain, or evidence of infection, please call the office immediately.  FOR ADULT SURGERY PATIENTS: If you need something for pain relief you may take 1 extra strength Tylenol (acetaminophen) AND 2 Ibuprofen (200mg each) together every 4 hours as needed for pain. (do not take these if you are allergic to them or if you have a reason you should not take them.) Typically, you may only need pain medication for 1 to 3 days.   Melanoma ABCDEs  Melanoma is the most dangerous type of skin cancer, and is the leading cause of death from skin disease.  You are more likely to develop melanoma if you: Have light-colored skin, light-colored eyes, or red or blond hair Spend a lot of time in the sun Tan regularly, either outdoors or in a tanning bed Have had blistering sunburns, especially during childhood Have a close family member who has had a melanoma Have atypical moles or large birthmarks  Early detection of  melanoma is key since treatment is typically straightforward and cure rates are extremely high if we catch it early.   The first sign of melanoma is often a change in a mole or a new dark spot.  The ABCDE system is a way of remembering the signs of melanoma.  A for asymmetry:  The two halves do not match. B for border:  The edges of the growth are irregular. C for color:  A mixture of colors are present instead of an even brown color. D for diameter:  Melanomas are usually (but not always) greater than 6mm - the size of a pencil eraser. E for evolution:  The spot keeps changing in size, shape, and color.  Please check your skin once per month between visits. You can use a small mirror in front and a large mirror behind you to keep an eye on the back side or your body.   If you see any new or changing lesions before your next follow-up, please call to schedule a visit.  Please continue daily skin protection including broad spectrum sunscreen SPF 30+ to sun-exposed areas, reapplying every 2 hours as needed when you're outdoors.    Due to recent changes in healthcare laws, you may see results of your pathology and/or laboratory studies on MyChart before the doctors have had a chance to review them. We understand that in some cases there may be results that are confusing or concerning to you. Please understand that not all results   are received at the same time and often the doctors may need to interpret multiple results in order to provide you with the best plan of care or course of treatment. Therefore, we ask that you please give us 2 business days to thoroughly review all your results before contacting the office for clarification. Should we see a critical lab result, you will be contacted sooner.   If You Need Anything After Your Visit  If you have any questions or concerns for your doctor, please call our main line at [phone] and press option 4 to reach your doctor's medical assistant. If  no one answers, please leave a voicemail as directed and we will return your call as soon as possible. Messages left after 4 pm will be answered the following business day.   You may also send us a message via MyChart. We typically respond to MyChart messages within 1-2 business days.  For prescription refills, please ask your pharmacy to contact our office. Our fax number is [phone].  If you have an urgent issue when the clinic is closed that cannot wait until the next business day, you can page your doctor at the number below.    Please note that while we do our best to be available for urgent issues outside of office hours, we are not available 24/7.   If you have an urgent issue and are unable to reach us, you may choose to seek medical care at your doctor's office, retail clinic, urgent care center, or emergency room.  If you have a medical emergency, please immediately call 911 or go to the emergency department.  Pager Numbers  - Dr. Kowalski: [phone]  - Dr. Moye: [phone]  - Dr. Stewart: [phone]  In the event of inclement weather, please call our main line at [phone] for an update on the status of any delays or closures.  Dermatology Medication Tips: Please keep the boxes that topical medications come in in order to help keep track of the instructions about where and how to use these. Pharmacies typically print the medication instructions only on the boxes and not directly on the medication tubes.   If your medication is too expensive, please contact our office at [phone] option 4 or send us a message through MyChart.   We are unable to tell what your co-pay for medications will be in advance as this is different depending on your insurance coverage. However, we may be able to find a substitute medication at lower cost or fill out paperwork to get insurance to cover a needed medication.   If a prior authorization is required to get your medication  covered by your insurance company, please allow us 1-2 business days to complete this process.  Drug prices often vary depending on where the prescription is filled and some pharmacies may offer cheaper prices.  The website www.goodrx.com contains coupons for medications through different pharmacies. The prices here do not account for what the cost may be with help from insurance (it may be cheaper with your insurance), but the website can give you the price if you did not use any insurance.  - You can print the associated coupon and take it with your prescription to the pharmacy.  - You may also stop by our office during regular business hours and pick up a GoodRx coupon card.  - If you need your prescription sent electronically to a different pharmacy, notify our office through [facility] MyChart or by phone at [phone] option   4.     Si Usted Necesita Algo Despus de Su Visita  Tambin puede enviarnos un mensaje a travs de MyChart. Por lo general respondemos a los mensajes de MyChart en el transcurso de 1 a 2 das hbiles.  Para renovar recetas, por favor pida a su farmacia que se ponga en contacto con nuestra oficina. Nuestro nmero de fax es el [phone].  Si tiene un asunto urgente cuando la clnica est cerrada y que no puede esperar hasta el siguiente da hbil, puede llamar/localizar a su doctor(a) al nmero que aparece a continuacin.   Por favor, tenga en cuenta que aunque hacemos todo lo posible para estar disponibles para asuntos urgentes fuera del horario de oficina, no estamos disponibles las 24 horas del da, los 7 das de la semana.   Si tiene un problema urgente y no puede comunicarse con nosotros, puede optar por buscar atencin mdica  en el consultorio de su doctor(a), en una clnica privada, en un centro de atencin urgente o en una sala de emergencias.  Si tiene una emergencia mdica, por favor llame inmediatamente al 911 o vaya a la sala de  emergencias.  Nmeros de bper  - Dr. Kowalski: [phone]  - Dra. Moye: [phone]  - Dra. Stewart: [phone]  En caso de inclemencias del tiempo, por favor llame a nuestra lnea principal al [phone] para una actualizacin sobre el estado de cualquier retraso o cierre.  Consejos para la medicacin en dermatologa: Por favor, guarde las cajas en las que vienen los medicamentos de uso tpico para ayudarle a seguir las instrucciones sobre dnde y cmo usarlos. Las farmacias generalmente imprimen las instrucciones del medicamento slo en las cajas y no directamente en los tubos del medicamento.   Si su medicamento es muy caro, por favor, pngase en contacto con nuestra oficina llamando al [phone] y presione la opcin 4 o envenos un mensaje a travs de MyChart.   No podemos decirle cul ser su copago por los medicamentos por adelantado ya que esto es diferente dependiendo de la cobertura de su seguro. Sin embargo, es posible que podamos encontrar un medicamento sustituto a menor costo o llenar un formulario para que el seguro cubra el medicamento que se considera necesario.   Si se requiere una autorizacin previa para que su compaa de seguros cubra su medicamento, por favor permtanos de 1 a 2 das hbiles para completar este proceso.  Los precios de los medicamentos varan con frecuencia dependiendo del lugar de dnde se surte la receta y alguna farmacias pueden ofrecer precios ms baratos.  El sitio web www.goodrx.com tiene cupones para medicamentos de diferentes farmacias. Los precios aqu no tienen en cuenta lo que podra costar con la ayuda del seguro (puede ser ms barato con su seguro), pero el sitio web puede darle el precio si no utiliz ningn seguro.  - Puede imprimir el cupn correspondiente y llevarlo con su receta a la farmacia.  - Tambin puede pasar por nuestra oficina durante el horario de atencin regular y recoger una tarjeta de cupones de GoodRx.  - Si  necesita que su receta se enve electrnicamente a una farmacia diferente, informe a nuestra oficina a travs de MyChart de [facility] o por telfono llamando al [phone] y presione la opcin 4.  

## 2023-03-04 ENCOUNTER — Telehealth: Payer: Self-pay

## 2023-03-04 NOTE — Telephone Encounter (Signed)
-----   Message from Brendolyn Patty, MD sent at 03/04/2023  1:18 PM EDT ----- Skin , left abdomen DYSPLASTIC JUNCTIONAL LENTIGINOUS NEVUS WITH MODERATE ATYPIA, CLOSE TO MARGIN  Moderately atypical mole, observation - please call patient

## 2023-03-04 NOTE — Telephone Encounter (Signed)
Left message for patient to call for biopsy results. 

## 2023-03-04 NOTE — Telephone Encounter (Signed)
Discussed biopsy results with patient 

## 2023-03-15 ENCOUNTER — Telehealth: Payer: No Typology Code available for payment source | Admitting: Family Medicine

## 2023-03-15 DIAGNOSIS — J40 Bronchitis, not specified as acute or chronic: Secondary | ICD-10-CM | POA: Diagnosis not present

## 2023-03-15 MED ORDER — PROMETHAZINE-DM 6.25-15 MG/5ML PO SYRP: 5.0000 mL | ORAL_SOLUTION | Freq: Four times a day (QID) | ORAL | 0 refills | Status: AC | PRN

## 2023-03-15 MED ORDER — AZITHROMYCIN 250 MG PO TABS: ORAL_TABLET | ORAL | 0 refills | Status: AC

## 2023-03-15 NOTE — Progress Notes (Signed)
Virtual Visit Consent   Allison Howard, you are scheduled for a virtual visit with a [facility] provider today. Just as with appointments in the office, your consent must be obtained to participate. Your consent will be active for this visit and any virtual visit you may have with one of our providers in the next 365 days. If you have a MyChart account, a copy of this consent can be sent to you electronically.  As this is a virtual visit, video technology does not allow for your provider to perform a traditional examination. This may limit your provider's ability to fully assess your condition. If your provider identifies any concerns that need to be evaluated in person or the need to arrange testing (such as labs, EKG, etc.), we will make arrangements to do so. Although advances in technology are sophisticated, we cannot ensure that it will always work on either your end or our end. If the connection with a video visit is poor, the visit may have to be switched to a telephone visit. With either a video or telephone visit, we are not always able to ensure that we have a secure connection.  By engaging in this virtual visit, you consent to the provision of healthcare and authorize for your insurance to be billed (if applicable) for the services provided during this visit. Depending on your insurance coverage, you may receive a charge related to this service.  I need to obtain your verbal consent now. Are you willing to proceed with your visit today? Allison Howard has provided verbal consent on 03/15/2023 for a virtual visit (video or telephone). Allison Nims, FNP  Date: 03/15/2023 1:07 PM  Virtual Visit via Video Note   I, Allison Howard, connected with  Allison Howard  ([id number], 30-Mar-1966) on 03/15/23 at  1:15 PM EDT by a video-enabled telemedicine application and verified that I am speaking with the correct person using two identifiers.  Location: Patient: Virtual Visit Location Patient:  Home Provider: Virtual Visit Location Provider: Home Office   I discussed the limitations of evaluation and management by telemedicine and the availability of in person appointments. The patient expressed understanding and agreed to proceed.    History of Present Illness: Allison Howard is a 57 y.o. who identifies as a female who was assigned female at birth, and is being seen today for deep cough, fever, chills and body aches for 5-6  days worsening. No wheezing or sob. Green mucus. Some head congestion. Her husband is also sick. Allison Howard Kitchen  HPI: HPI  Problems:  Patient Active Problem List   Diagnosis Date Noted   HTN (hypertension) 07/25/2021   Hypokalemia 07/25/2021   Dyslipidemia 07/25/2021   COVID-19 virus infection 05/02/2020    Allergies: No Known Allergies Medications:  Current Outpatient Medications:    azithromycin (ZITHROMAX) 250 MG tablet, Take 2 tablets on day 1, then 1 tablet daily on days 2 through 5, Disp: 6 tablet, Rfl: 0   promethazine-dextromethorphan (PROMETHAZINE-DM) 6.25-15 MG/5ML syrup, Take 5 mLs by mouth 4 (four) times daily as needed for up to 10 days for cough., Disp: 118 mL, Rfl: 0   Ascorbic Acid POWD, by Does not apply route., Disp: , Rfl:    Ashwagandha 500 MG CAPS, Take by mouth., Disp: , Rfl:    Cholecalciferol (VITAMIN D3) 250 MCG (10000 UT) capsule, Take 10,000 Units by mouth daily., Disp: , Rfl:    CITRUS BERGAMOT PO, Take by mouth., Disp: , Rfl:    Collagen-Vitamin C-Biotin (  COLLAGEN 1500/C PO), Take by mouth., Disp: , Rfl:    furosemide (LASIX) 20 MG tablet, Take 1 tablet (20 mg total) by mouth daily., Disp: 90 tablet, Rfl: 3   progesterone (PROMETRIUM) 200 MG capsule, Take 200 mg by mouth at bedtime., Disp: , Rfl:    triamterene-hydrochlorothiazide (MAXZIDE-25) 37.5-25 MG tablet, Take 1 tablet by mouth daily., Disp: 90 tablet, Rfl: 3   VITAMIN E PO, Take by mouth., Disp: , Rfl:   Observations/Objective: Patient is well-developed, well-nourished in no  acute distress.  Resting comfortably  at home.  Head is normocephalic, atraumatic.  No labored breathing.  Speech is clear and coherent with logical content.  Patient is alert and oriented at baseline.    Assessment and Plan: 1. Bronchitis  Increase fluids, humidifier at night, tyleno or ibuprofen as directed, UC if sx worsen. .  Follow Up Instructions: I discussed the assessment and treatment plan with the patient. The patient was provided an opportunity to ask questions and all were answered. The patient agreed with the plan and demonstrated an understanding of the instructions.  A copy of instructions were sent to the patient via MyChart unless otherwise noted below.     The patient was advised to call back or seek an in-person evaluation if the symptoms worsen or if the condition fails to improve as anticipated.  Time:  I spent 10 minutes with the patient via telehealth technology discussing the above problems/concerns.    Allison Nims, FNP

## 2023-03-15 NOTE — Patient Instructions (Signed)

## 2023-04-16 ENCOUNTER — Other Ambulatory Visit: Payer: Self-pay | Admitting: Dermatology

## 2023-04-16 DIAGNOSIS — W57XXXA Bitten or stung by nonvenomous insect and other nonvenomous arthropods, initial encounter: Secondary | ICD-10-CM

## 2023-05-16 ENCOUNTER — Encounter: Payer: Self-pay | Admitting: Family Medicine

## 2023-05-17 ENCOUNTER — Other Ambulatory Visit: Payer: Self-pay

## 2023-05-21 ENCOUNTER — Encounter: Payer: Self-pay | Admitting: Family Medicine

## 2023-05-21 ENCOUNTER — Ambulatory Visit (INDEPENDENT_AMBULATORY_CARE_PROVIDER_SITE_OTHER): Payer: No Typology Code available for payment source | Admitting: Family Medicine

## 2023-05-21 VITALS — BP 118/74 | HR 73 | Temp 98.4°F | Wt 182.2 lb

## 2023-05-21 DIAGNOSIS — R600 Localized edema: Secondary | ICD-10-CM

## 2023-05-21 MED ORDER — FUROSEMIDE 20 MG PO TABS: ORAL_TABLET | ORAL | 3 refills | Status: DC

## 2023-05-21 NOTE — Progress Notes (Signed)
   Subjective:    Patient ID: Allison Howard, female    DOB: 09/19/1966, 57 y.o.   MRN: [id number]  HPI Here for some worsening of the swelling in her feet and lower legs. She has this year round, but it always becomes more of a problem in warmer weather. She currently takes Lasix 20 mg daily. She had labs last October showing normal renal function with a creatinine of 0.92 and a GFR of 74. No SOB.    Review of Systems  Constitutional: Negative.   Respiratory: Negative.    Cardiovascular:  Positive for leg swelling. Negative for chest pain and palpitations.       Objective:   Physical Exam Constitutional:      Appearance: Normal appearance.  Cardiovascular:     Rate and Rhythm: Normal rate and regular rhythm.     Pulses: Normal pulses.     Heart sounds: Normal heart sounds.  Pulmonary:     Effort: Pulmonary effort is normal.     Breath sounds: Normal breath sounds.  Musculoskeletal:     Comments: 2+ edema in both lower legs   Neurological:     Mental Status: She is alert.           Assessment & Plan:  Leg edema. We will increase the Lasix to where she can take 40 mg each day as needed. Follow up prn.  Gershon Crane, MD

## 2023-06-27 ENCOUNTER — Other Ambulatory Visit: Payer: Self-pay

## 2023-06-27 DIAGNOSIS — Z1231 Encounter for screening mammogram for malignant neoplasm of breast: Secondary | ICD-10-CM

## 2023-07-03 ENCOUNTER — Ambulatory Visit
Admission: RE | Admit: 2023-07-03 | Discharge: 2023-07-03 | Disposition: A | Discharge status: Home / Self Care | Payer: No Typology Code available for payment source | Source: Ambulatory Visit

## 2023-07-03 DIAGNOSIS — Z1231 Encounter for screening mammogram for malignant neoplasm of breast: Secondary | ICD-10-CM | POA: Insufficient documentation

## 2023-09-03 ENCOUNTER — Encounter: Payer: No Typology Code available for payment source | Admitting: Dermatology

## 2023-09-04 ENCOUNTER — Ambulatory Visit: Payer: No Typology Code available for payment source | Admitting: Dermatology

## 2023-09-04 VITALS — BP 128/79 | HR 69

## 2023-09-04 DIAGNOSIS — L814 Other melanin hyperpigmentation: Secondary | ICD-10-CM

## 2023-09-04 DIAGNOSIS — W098XXA Fall on or from other playground equipment, initial encounter: Secondary | ICD-10-CM

## 2023-09-04 DIAGNOSIS — L309 Dermatitis, unspecified: Secondary | ICD-10-CM | POA: Diagnosis not present

## 2023-09-04 DIAGNOSIS — L821 Other seborrheic keratosis: Secondary | ICD-10-CM

## 2023-09-04 DIAGNOSIS — D225 Melanocytic nevi of trunk: Secondary | ICD-10-CM

## 2023-09-04 DIAGNOSIS — D485 Neoplasm of uncertain behavior of skin: Secondary | ICD-10-CM

## 2023-09-04 DIAGNOSIS — Z85828 Personal history of other malignant neoplasm of skin: Secondary | ICD-10-CM

## 2023-09-04 DIAGNOSIS — Z1283 Encounter for screening for malignant neoplasm of skin: Secondary | ICD-10-CM

## 2023-09-04 DIAGNOSIS — D229 Melanocytic nevi, unspecified: Secondary | ICD-10-CM

## 2023-09-04 DIAGNOSIS — L578 Other skin changes due to chronic exposure to nonionizing radiation: Secondary | ICD-10-CM

## 2023-09-04 DIAGNOSIS — L570 Actinic keratosis: Secondary | ICD-10-CM

## 2023-09-04 DIAGNOSIS — Z86006 Personal history of melanoma in-situ: Secondary | ICD-10-CM

## 2023-09-04 DIAGNOSIS — C44722 Squamous cell carcinoma of skin of right lower limb, including hip: Secondary | ICD-10-CM

## 2023-09-04 DIAGNOSIS — C44622 Squamous cell carcinoma of skin of right upper limb, including shoulder: Secondary | ICD-10-CM | POA: Diagnosis not present

## 2023-09-04 DIAGNOSIS — L820 Inflamed seborrheic keratosis: Secondary | ICD-10-CM

## 2023-09-04 HISTORY — DX: Actinic keratosis: L57.0

## 2023-09-04 MED ORDER — PIMECROLIMUS 1 % EX CREA
TOPICAL_CREAM | CUTANEOUS | 0 refills | Status: DC
Start: 2023-09-04 — End: 2024-01-13

## 2023-09-04 NOTE — Progress Notes (Signed)
Follow-Up Visit   Subjective  Allison Howard is a 57 y.o. female who presents for the following: Skin Cancer Screening and Full Body Skin Exam  The patient presents for Total-Body Skin Exam (TBSE) for skin cancer screening and mole check. The patient has spots, moles and lesions to be evaluated, some may be new or changing and the patient may have concern these could be cancer. She has a couple of irritating growths on her legs she would like removed, picks at. She has pinkness of the face with itching for several months. No changes in moisturizers or facial products.    The following portions of the chart were reviewed this encounter and updated as appropriate: medications, allergies, medical history  Review of Systems:  No other skin or systemic complaints except as noted in HPI or Assessment and Plan.  Objective  Well appearing patient in no apparent distress; mood and affect are within normal limits.  A full examination was performed including scalp, head, eyes, ears, nose, lips, neck, chest, axillae, abdomen, back, buttocks, bilateral upper extremities, bilateral lower extremities, hands, feet, fingers, toes, fingernails, and toenails. All findings within normal limits unless otherwise noted below.   Relevant physical exam findings are noted in the Assessment and Plan.  L calf x 1, L lower pretibia x 1, R lower pretibia x 1, R forearm x 1, L ant thigh x 1 (5) Erythematous stuck-on, waxy papule or plaque  Right upper pretibia 8mm pink scaly papule          Left lower thigh 8.0 mm keratotic papule       nose x 2, L upper eyebrow x 1 (3) Pink scaly macules.  face Erythema of the cheeks/mid face with mild xerosis    Assessment & Plan   SKIN CANCER SCREENING PERFORMED TODAY.  ACTINIC DAMAGE - Chronic condition, secondary to cumulative UV/sun exposure - diffuse scaly erythematous macules with underlying dyspigmentation - Recommend daily broad spectrum  sunscreen SPF 30+ to sun-exposed areas, reapply every 2 hours as needed.  - Staying in the shade or wearing long sleeves, sun glasses (UVA+UVB protection) and wide brim hats (4-inch brim around the entire circumference of the hat) are also recommended for sun protection.  - Call for new or changing lesions.  LENTIGINES, SEBORRHEIC KERATOSES, HEMANGIOMAS - Benign normal skin lesions - Benign-appearing - Call for any changes  MELANOCYTIC NEVI - Tan-brown and/or pink-flesh-colored symmetric macules and papules - 4.0 mm med dark brown macule at right spinal lower back - 6 x 3 mm brown macule at right flank - Brown macule and papules trunk, photos compared, no changes - Benign appearing on exam today - Observation - Call clinic for new or changing moles - Recommend daily use of broad spectrum spf 30+ sunscreen to sun-exposed areas.   History of Squamous Cell Carcinoma of the Skin - Left pretibia clear with biopsy (10/30/2021). Right upper lip, clear (Mohs 01/01/2022  - No evidence of recurrence today - Recommend regular full body skin exams - Recommend daily broad spectrum sunscreen SPF 30+ to sun-exposed areas, reapply every 2 hours as needed.  - Call if any new or changing lesions are noted between office visits   History of Melanoma in Situ- 08/2021 - No evidence of recurrence today at left lower back - Recommend regular full body skin exams - Recommend daily broad spectrum sunscreen SPF 30+ to sun-exposed areas, reapply every 2 hours as needed.  - Call if any new or changing lesions are noted  between office visits  Inflamed seborrheic keratosis (5) L calf x 1, L lower pretibia x 1, R lower pretibia x 1, R forearm x 1, L ant thigh x 1  Symptomatic, irritating, patient would like treated.  Destruction of lesion - L calf x 1, L lower pretibia x 1, R lower pretibia x 1, R forearm x 1, L ant thigh x 1 (5)  Destruction method: cryotherapy   Informed consent: discussed and consent  obtained   Lesion destroyed using liquid nitrogen: Yes   Region frozen until ice ball extended beyond lesion: Yes   Outcome: patient tolerated procedure well with no complications   Post-procedure details: wound care instructions given   Additional details:  Prior to procedure, discussed risks of blister formation, small wound, skin dyspigmentation, or rare scar following cryotherapy. Recommend Vaseline ointment to treated areas while healing.   Neoplasm of uncertain behavior of skin (2) Right upper pretibia  Epidermal / dermal shaving  Lesion diameter (cm):  0.9 Informed consent: discussed and consent obtained   Patient was prepped and draped in usual sterile fashion: Area prepped with alcohol. Anesthesia: the lesion was anesthetized in a standard fashion   Anesthetic:  1% lidocaine w/ epinephrine 1-100,000 buffered w/ 8.4% NaHCO3 Instrument used: flexible razor blade   Hemostasis achieved with: pressure, aluminum chloride and electrodesiccation   Outcome: patient tolerated procedure well    Destruction of lesion  Destruction method: electrodesiccation and curettage   Informed consent: discussed and consent obtained   Curettage performed in three different directions: Yes   Electrodesiccation performed over the curetted area: Yes   Final wound size (cm):  0.9 Hemostasis achieved with:  pressure, aluminum chloride and electrodesiccation Outcome: patient tolerated procedure well with no complications   Post-procedure details: wound care instructions given   Post-procedure details comment:  Ointment and bandage applied.  Specimen 1 - Surgical pathology Differential Diagnosis: Hypertrophic AK r/o SCC Check Margins: No EDC today  Left lower thigh  Epidermal / dermal shaving  Lesion diameter (cm):  0.9 Informed consent: discussed and consent obtained   Patient was prepped and draped in usual sterile fashion: Area prepped with alcohol. Anesthesia: the lesion was anesthetized  in a standard fashion   Anesthetic:  1% lidocaine w/ epinephrine 1-100,000 buffered w/ 8.4% NaHCO3 Instrument used: flexible razor blade   Hemostasis achieved with: pressure, aluminum chloride and electrodesiccation   Outcome: patient tolerated procedure well    Destruction of lesion  Destruction method: electrodesiccation and curettage   Informed consent: discussed and consent obtained   Curettage performed in three different directions: Yes   Electrodesiccation performed over the curetted area: Yes   Final wound size (cm):  0.9 Hemostasis achieved with:  pressure, aluminum chloride and electrodesiccation Outcome: patient tolerated procedure well with no complications   Post-procedure details: wound care instructions given   Post-procedure details comment:  Ointment and bandage applied.  Specimen 2 - Surgical pathology Differential Diagnosis: Hypertrophic AK r/o BCC Check Margins: No EDC today  AK (actinic keratosis) (3) nose x 2, L upper eyebrow x 1  Actinic keratoses are precancerous spots that appear secondary to cumulative UV radiation exposure/sun exposure over time. They are chronic with expected duration over 1 year. A portion of actinic keratoses will progress to squamous cell carcinoma of the skin. It is not possible to reliably predict which spots will progress to skin cancer and so treatment is recommended to prevent development of skin cancer.  Recommend daily broad spectrum sunscreen SPF 30+ to sun-exposed  areas, reapply every 2 hours as needed.  Recommend staying in the shade or wearing long sleeves, sun glasses (UVA+UVB protection) and wide brim hats (4-inch brim around the entire circumference of the hat). Call for new or changing lesions.  Destruction of lesion - nose x 2, L upper eyebrow x 1 (3)  Destruction method: cryotherapy   Informed consent: discussed and consent obtained   Lesion destroyed using liquid nitrogen: Yes   Region frozen until ice ball  extended beyond lesion: Yes   Outcome: patient tolerated procedure well with no complications   Post-procedure details: wound care instructions given   Additional details:  Prior to procedure, discussed risks of blister formation, small wound, skin dyspigmentation, or rare scar following cryotherapy. Recommend Vaseline ointment to treated areas while healing.   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 Condition is bothersome/symptomatic for patient. Currently flared.   Recommend mild soap and moisturizing cream 1-2 times daily.  Gentle skin care handout provided. Samples given.   Start pimecrolimus cream Apply to rash on face BID until improved dsp 60g 0Rf. If not improved in 1 month, will treat for rosacea and send in topical abx cream.    pimecrolimus (ELIDEL) 1 % cream - face Apply to rash on face twice a day.  Skin cancer screening  Actinic skin damage  Lentigo  Seborrheic keratosis  Nevus  History of SCC (squamous cell carcinoma) of skin  History of melanoma in situ   Return in about 6 months (around 03/03/2024).  ICherlyn Labella, CMA, am acting as scribe for Willeen Niece, MD .   Documentation: I have reviewed the above documentation for accuracy and completeness, and I agree with the above.  Willeen Niece, MD

## 2023-09-04 NOTE — Patient Instructions (Addendum)
Cryotherapy Aftercare  Wash gently with soap and water everyday.   Apply Vaseline and Band-Aid daily until healed.   Wound Care Instructions  Cleanse wound gently with soap and water once a day then pat dry with clean gauze. Apply a thin coat of Petrolatum (petroleum jelly, "Vaseline") over the wound (unless you have an allergy to this). We recommend that you use a new, sterile tube of Vaseline. Do not pick or remove scabs. Do not remove the yellow or white "healing tissue" from the base of the wound.  Cover the wound with fresh, clean, nonstick gauze and secure with paper tape. You may use Band-Aids in place of gauze and tape if the wound is small enough, but would recommend trimming much of the tape off as there is often too much. Sometimes Band-Aids can irritate the skin.  You should call the office for your biopsy report after 1 week if you have not already been contacted.  If you experience any problems, such as abnormal amounts of bleeding, swelling, significant bruising, significant pain, or evidence of infection, please call the office immediately.  FOR ADULT SURGERY PATIENTS: If you need something for pain relief you may take 1 extra strength Tylenol (acetaminophen) AND 2 Ibuprofen (200mg  each) together every 4 hours as needed for pain. (do not take these if you are allergic to them or if you have a reason you should not take them.) Typically, you may only need pain medication for 1 to 3 days.     Melanoma ABCDEs  Melanoma is the most dangerous type of skin cancer, and is the leading cause of death from skin disease.  You are more likely to develop melanoma if you: Have light-colored skin, light-colored eyes, or red or blond hair Spend a lot of time in the sun Tan regularly, either outdoors or in a tanning bed Have had blistering sunburns, especially during childhood Have a close family member who has had a melanoma Have atypical moles or large birthmarks  Early detection of  melanoma is key since treatment is typically straightforward and cure rates are extremely high if we catch it early.   The first sign of melanoma is often a change in a mole or a new dark spot.  The ABCDE system is a way of remembering the signs of melanoma.  A for asymmetry:  The two halves do not match. B for border:  The edges of the growth are irregular. C for color:  A mixture of colors are present instead of an even brown color. D for diameter:  Melanomas are usually (but not always) greater than 6mm - the size of a pencil eraser. E for evolution:  The spot keeps changing in size, shape, and color.  Please check your skin once per month between visits. You can use a small mirror in front and a large mirror behind you to keep an eye on the back side or your body.   If you see any new or changing lesions before your next follow-up, please call to schedule a visit.  Please continue daily skin protection including broad spectrum sunscreen SPF 30+ to sun-exposed areas, reapplying every 2 hours as needed when you're outdoors.   Staying in the shade or wearing long sleeves, sun glasses (UVA+UVB protection) and wide brim hats (4-inch brim around the entire circumference of the hat) are also recommended for sun protection.      Gentle Skin Care Guide  1. Bathe no more than once a day.  2.  Avoid bathing in hot water  3. Use a mild soap like Dove, Vanicream, Cetaphil, CeraVe. Can use Lever 2000 or Cetaphil antibacterial soap  4. Use soap only where you need it. On most days, use it under your arms, between your legs, and on your feet. Let the water rinse other areas unless visibly dirty.  5. When you get out of the bath/shower, use a towel to gently blot your skin dry, don't rub it.  6. While your skin is still a little damp, apply a moisturizing cream such as Vanicream, CeraVe, Cetaphil, Eucerin, Sarna lotion or plain Vaseline Jelly. For hands apply Neutrogena [place] Hand Cream or  Excipial Hand Cream.  7. Reapply moisturizer any time you start to itch or feel dry.  8. Sometimes using free and clear laundry detergents can be helpful. Fabric softener sheets should be avoided. Downy Free & Gentle liquid, or any liquid fabric softener that is free of dyes and perfumes, it acceptable to use  9. If your doctor has given you prescription creams you may apply moisturizers over them    Due to recent changes in healthcare laws, you may see results of your pathology and/or laboratory studies on MyChart before the doctors have had a chance to review them. We understand that in some cases there may be results that are confusing or concerning to you. Please understand that not all results are received at the same time and often the doctors may need to interpret multiple results in order to provide you with the best plan of care or course of treatment. Therefore, we ask that you please give [place] 2 business days to thoroughly review all your results before contacting the office for clarification. Should we see a critical lab result, you will be contacted sooner.   If You Need Anything After Your Visit  If you have any questions or concerns for your doctor, please call our main line at [phone] and press option 4 to reach your doctor's medical assistant. If no one answers, please leave a voicemail as directed and we will return your call as soon as possible. Messages left after 4 pm will be answered the following business day.   You may also send [place] a message via MyChart. We typically respond to MyChart messages within 1-2 business days.  For prescription refills, please ask your pharmacy to contact our office. Our fax number is [phone].  If you have an urgent issue when the clinic is closed that cannot wait until the next business day, you can page your doctor at the number below.    Please note that while we do our best to be available for urgent issues outside of office hours,  we are not available 24/7.   If you have an urgent issue and are unable to reach [place], you may choose to seek medical care at your doctor's office, retail clinic, urgent care center, or emergency room.  If you have a medical emergency, please immediately call 911 or go to the emergency department.  Pager Numbers  - Dr. Gwen Pounds: [phone]  - Dr. Roseanne Reno: [phone]  - Dr. Katrinka Blazing: [phone]   In the event of inclement weather, please call our main line at [phone] for an update on the status of any delays or closures.  Dermatology Medication Tips: Please keep the boxes that topical medications come in in order to help keep track of the instructions about where and how to use these. Pharmacies typically print the medication instructions only on the  boxes and not directly on the medication tubes.   If your medication is too expensive, please contact our office at [phone] option 4 or send [place] a message through MyChart.   We are unable to tell what your co-pay for medications will be in advance as this is different depending on your insurance coverage. However, we may be able to find a substitute medication at lower cost or fill out paperwork to get insurance to cover a needed medication.   If a prior authorization is required to get your medication covered by your insurance company, please allow [place] 1-2 business days to complete this process.  Drug prices often vary depending on where the prescription is filled and some pharmacies may offer cheaper prices.  The website www.goodrx.com contains coupons for medications through different pharmacies. The prices here do not account for what the cost may be with help from insurance (it may be cheaper with your insurance), but the website can give you the price if you did not use any insurance.  - You can print the associated coupon and take it with your prescription to the pharmacy.  - You may also stop by our office during regular  business hours and pick up a GoodRx coupon card.  - If you need your prescription sent electronically to a different pharmacy, notify our office through [facility] or by phone at [phone] option 4.     Si Usted Necesita Algo Despus de Su Visita  Tambin puede enviarnos un mensaje a travs de Clinical cytogeneticist. Por lo general respondemos a los mensajes de MyChart en el transcurso de 1 a 2 das hbiles.  Para renovar recetas, por favor pida a su farmacia que se ponga en contacto con nuestra oficina. Annie Sable de fax es [place] [phone].  Si tiene un asunto urgente cuando la clnica est cerrada y que no puede esperar hasta el siguiente da hbil, puede llamar/localizar a su doctor(a) al nmero que aparece a continuacin.   Por favor, tenga en cuenta que aunque hacemos todo lo posible para estar disponibles para asuntos urgentes fuera del horario de [place], no estamos disponibles las 24 horas del da, los 7 [address]  [address] de la [place].   Si tiene un problema urgente y no puede comunicarse con nosotros, puede optar por buscar atencin mdica  en el consultorio de su doctor(a), en una clnica privada, en un centro de atencin urgente o en una sala de emergencias.  Si tiene Engineer, drilling, por favor llame inmediatamente al 911 o vaya a la sala de emergencias.  Nmeros de bper  - Dr. Gwen Pounds: [phone]  - Dra. Roseanne Reno: [phone]  - Dr. Katrinka Blazing: [phone]   En caso de inclemencias del tiempo, por favor llame a Lacy Duverney principal al [phone] para una actualizacin sobre el [place] de cualquier retraso o cierre.  Consejos para la medicacin en dermatologa: Por favor, guarde las cajas en las que vienen los medicamentos de uso tpico para ayudarle a seguir las instrucciones sobre dnde y cmo usarlos. Las farmacias generalmente imprimen las instrucciones del medicamento slo en las cajas y no directamente en los tubos del [place].   Si su medicamento es muy caro, por  favor, pngase en contacto con Rolm Gala llamando al [phone] y presione la opcin 4 o envenos un mensaje a travs de Clinical cytogeneticist.   No podemos decirle cul ser su copago por los medicamentos por adelantado ya que esto es diferente dependiendo de la cobertura de su seguro. Sin embargo, es posible que  podamos encontrar un medicamento sustituto a Audiological scientist un formulario para que el seguro cubra el medicamento que se considera necesario.   Si se requiere una autorizacin previa para que su compaa de seguros [place] su medicamento, por favor permtanos de 1 a 2 das hbiles para completar [address]  Los precios de los medicamentos varan con frecuencia dependiendo del Environmental consultant de dnde se surte la receta y alguna farmacias pueden ofrecer precios ms baratos.  El sitio web www.goodrx.com tiene cupones para medicamentos de Health and safety inspector. Los precios aqu no tienen en cuenta lo que podra costar con la ayuda del seguro (puede ser ms barato con su seguro), pero el sitio web puede darle el precio si no utiliz Tourist information centre manager.  - Puede imprimir el cupn correspondiente y llevarlo con su receta a la farmacia.  - Tambin puede pasar por nuestra oficina durante el horario de atencin regular y Education officer, museum una tarjeta de cupones de GoodRx.  - Si necesita que su receta se enve electrnicamente a una farmacia diferente, informe a nuestra oficina a travs de MyChart de [place] o por telfono llamando al [phone] y presione la opcin 4.

## 2023-09-06 LAB — SURGICAL PATHOLOGY

## 2023-09-09 ENCOUNTER — Telehealth: Payer: Self-pay

## 2023-09-09 NOTE — Telephone Encounter (Signed)
-----   Message from Willeen Niece sent at 09/09/2023 10:21 AM EDT ----- 1. Skin, right upper pretibia :       WELL DIFFERENTIATED SQUAMOUS CELL CARCINOMA  2. 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 SCC skin cancer- already treated with EDC at time of biopsy 2. Thick precancer with wart features- already treated with EDC at time of biopsy   - please call patient

## 2023-09-09 NOTE — Telephone Encounter (Signed)
Advised patient biopsy of the right upper pretibia was SCC and was treated with EDC. Biopsy of the left lower thigh was hypertrophic AK with features of a wart, treated with EDC. Will recheck at follow-up appointment.

## 2023-10-15 ENCOUNTER — Encounter: Payer: Self-pay | Admitting: Dermatology

## 2023-10-15 MED ORDER — METRONIDAZOLE 0.75 % EX CREA: TOPICAL_CREAM | Freq: Two times a day (BID) | CUTANEOUS | 2 refills | Status: AC

## 2023-10-15 MED ORDER — DOXYCYCLINE HYCLATE 20 MG PO TABS: 20.0000 mg | ORAL_TABLET | Freq: Two times a day (BID) | ORAL | 2 refills | Status: DC

## 2023-10-15 MED ORDER — DOXYCYCLINE 40 MG PO CPDR: 40.0000 mg | DELAYED_RELEASE_CAPSULE | ORAL | 2 refills | Status: DC

## 2023-10-15 NOTE — Addendum Note (Signed)
Addended by: Dorathy Daft R on: 10/15/2023 03:46 PM   Modules accepted: Orders

## 2023-11-06 ENCOUNTER — Other Ambulatory Visit: Payer: Self-pay | Admitting: Dermatology

## 2023-12-06 ENCOUNTER — Telehealth: Payer: No Typology Code available for payment source | Admitting: Family Medicine

## 2023-12-06 DIAGNOSIS — S39012A Strain of muscle, fascia and tendon of lower back, initial encounter: Secondary | ICD-10-CM

## 2023-12-06 MED ORDER — METHOCARBAMOL 750 MG PO TABS
750.0000 mg | ORAL_TABLET | Freq: Four times a day (QID) | ORAL | 0 refills | Status: AC | PRN
Start: 2023-12-06 — End: 2023-12-16

## 2023-12-06 MED ORDER — PREDNISONE 20 MG PO TABS: 20.0000 mg | ORAL_TABLET | Freq: Two times a day (BID) | ORAL | 0 refills | Status: AC

## 2023-12-06 NOTE — Progress Notes (Signed)
Virtual Visit Consent   SUNG GAVAGHAN, you are scheduled for a virtual visit with a [facility] provider today. Just as with appointments in the office, your consent must be obtained to participate. Your consent will be active for this visit and any virtual visit you may have with one of our providers in the next 365 days. If you have a MyChart account, a copy of this consent can be sent to you electronically.  As this is a virtual visit, video technology does not allow for your provider to perform a traditional examination. This may limit your provider's ability to fully assess your condition. If your provider identifies any concerns that need to be evaluated in person or the need to arrange testing (such as labs, EKG, etc.), we will make arrangements to do so. Although advances in technology are sophisticated, we cannot ensure that it will always work on either your end or our end. If the connection with a video visit is poor, the visit may have to be switched to a telephone visit. With either a video or telephone visit, we are not always able to ensure that we have a secure connection.  By engaging in this virtual visit, you consent to the provision of healthcare and authorize for your insurance to be billed (if applicable) for the services provided during this visit. Depending on your insurance coverage, you may receive a charge related to this service.  I need to obtain your verbal consent now. Are you willing to proceed with your visit today? Allison Howard has provided verbal consent on 12/06/2023 for a virtual visit (video or telephone). Georgana Curio, FNP  Date: 12/06/2023 4:45 PM  Virtual Visit via Video Note   I, Georgana Curio, connected with  Allison Howard  ([id number], Dec 22, 1965) on 12/06/23 at  4:45 PM EST by a video-enabled telemedicine application and verified that I am speaking with the correct person using two identifiers.  Location: Patient: Virtual Visit Location Patient:  Home Provider: Virtual Visit Location Provider: Home Office   I discussed the limitations of evaluation and management by telemedicine and the availability of in person appointments. The patient expressed understanding and agreed to proceed.    History of Present Illness: Allison Howard is a 57 y.o. who identifies as a female who was assigned female at birth, and is being seen today for low back pain since turning in work chair Tuesday and felt a pull. She say her chiropractor wed and today with sx worsening. Marland Kitchen  HPI: HPI  Problems:  Patient Active Problem List   Diagnosis Date Noted   Bilateral leg edema 05/21/2023   HTN (hypertension) 07/25/2021   Hypokalemia 07/25/2021   Dyslipidemia 07/25/2021   COVID-19 virus infection 05/02/2020    Allergies: No Known Allergies Medications:  Current Outpatient Medications:    methocarbamol (ROBAXIN-750) 750 MG tablet, Take 1 tablet (750 mg total) by mouth every 6 (six) hours as needed for up to 10 days for muscle spasms., Disp: 30 tablet, Rfl: 0   predniSONE (DELTASONE) 20 MG tablet, Take 1 tablet (20 mg total) by mouth 2 (two) times daily with a meal for 5 days., Disp: 10 tablet, Rfl: 0   Ascorbic Acid POWD, by Does not apply route., Disp: , Rfl:    Ashwagandha 500 MG CAPS, Take by mouth., Disp: , Rfl:    Barberry-Oreg Grape-Goldenseal (BERBERINE COMPLEX PO), Take by mouth., Disp: , Rfl:    Cholecalciferol (VITAMIN D3) 250 MCG (10000 UT) capsule, Take 10,000  Units by mouth daily. With K, Disp: , Rfl:    Collagen-Vitamin C-Biotin (COLLAGEN 1500/C PO), Take by mouth., Disp: , Rfl:    Diindolylmethane POWD, 100 mg by Does not apply route. Capsule, Disp: , Rfl:    doxycycline (PERIOSTAT) 20 MG tablet, TAKE 1 TABLET BY MOUTH TWICE A DAY, Disp: 180 tablet, Rfl: 1   furosemide (LASIX) 20 MG tablet, Take one or two tablets each day as needed for fluid, Disp: 180 tablet, Rfl: 3   Magnesium 500 MG CAPS, Take by mouth. Complex, Disp: , Rfl:    Medium  Chain Triglycerides (MCT OIL PO), Take by mouth., Disp: , Rfl:    metroNIDAZOLE (METROCREAM) 0.75 % cream, Apply topically 2 (two) times daily., Disp: 45 g, Rfl: 2   OMEGA-3 KRILL OIL PO, Take 900 mg by mouth., Disp: , Rfl:    pimecrolimus (ELIDEL) 1 % cream, Apply to rash on face twice a day., Disp: 60 g, Rfl: 0   triamterene-hydrochlorothiazide (MAXZIDE-25) 37.5-25 MG tablet, Take 1 tablet by mouth daily., Disp: 90 tablet, Rfl: 3   TURMERIC PO, Take 1,000 mg by mouth., Disp: , Rfl:    VITAMIN E PO, Take by mouth., Disp: , Rfl:   Observations/Objective: Patient is well-developed, well-nourished in no acute distress.  Resting comfortably  at home.  Head is normocephalic, atraumatic.  No labored breathing.  Speech is clear and coherent with logical content.  Patient is alert and oriented at baseline.   Assessment and Plan: 1. Strain of lumbar region, initial encounter (Primary)  Ice and heat, med use discussed no ibuprofen, UC if sx worsen.   Follow Up Instructions: I discussed the assessment and treatment plan with the patient. The patient was provided an opportunity to ask questions and all were answered. The patient agreed with the plan and demonstrated an understanding of the instructions.  A copy of instructions were sent to the patient via MyChart unless otherwise noted below.     The patient was advised to call back or seek an in-person evaluation if the symptoms worsen or if the condition fails to improve as anticipated.    Georgana Curio, FNP

## 2023-12-06 NOTE — Patient Instructions (Signed)
 Lumbosacral Strain A lumbosacral strain is an injury that causes pain in the lower back (lumbosacral spine). This injury usually happens from overstretching the muscles or ligaments along the spine. Ligaments are cord-like tissues that connect bones to each other. A strain can affect one or more muscles or ligaments. What are the causes? This condition may be caused by: A hard, direct hit to the back. Overstretching the lower back muscles. This may result from: A fall. Lifting something heavy. Repeated movements such as bending or crouching. What increases the risk? The following factors make you more likely to have a lumbosacral strain: Taking part in sports or activities that involve: A sudden twist of the back. Pushing or pulling motions. Being overweight or obese. Having poor strength and flexibility, especially tight hamstrings or weak muscles in the back or abdomen. Having too much of a curve in the lower back. Having a pelvis that is tilted forward. What are the signs or symptoms? The main symptom of this condition is pain in the lower back, at the site of the strain. Pain may also be felt down one or both legs. How is this diagnosed? This condition is diagnosed based on: Your symptoms and medical history. A physical exam. During the exam: Your health care provider may push on certain areas of your back to find the source of your pain. You may be asked to bend forward, backward, and side to side to check your pain and range of motion. You may also have imaging tests, such as X-rays and an MRI. How is this treated? This condition may be treated by: Applying heat and cold to the affected area. Taking medicines for pain and to relax your muscles. Taking NSAIDs, such as ibuprofen, to help reduce swelling and discomfort. Doing stretching and strengthening exercises for your lower back. Symptoms usually improve within several weeks of treatment. But recovery time varies. When your  symptoms improve, gradually return to your normal routine as soon as possible. This will help reduce pain, avoid stiffness, and keep muscle strength. Follow these instructions at home: Medicines Take over-the-counter and prescription medicines only as told by your health care provider. Ask your health care provider if the medicine prescribed to you: Requires you to avoid driving or using machinery. Can cause constipation. You may need to take these actions to prevent or treat constipation: Drink enough fluid to keep your urine pale yellow. Take over-the-counter or prescription medicines. Eat foods that are high in fiber, such as beans, whole grains, and fresh fruits and vegetables. Limit foods that are high in fat and processed sugars, such as fried or sweet foods. Managing pain, stiffness, and swelling     If told, put ice on the injured area. Put ice in a plastic bag. Place a towel between your skin and the bag. Leave the ice on for 20 minutes, 2-3 times a day. If told, apply heat to the affected area as often as told by your health care provider. Use the heat source that your health care provider recommends, such as a moist heat pack or a heating pad. Place a towel between your skin and the heat source. Leave the heat on for 20-30 minutes. If your skin turns bright red, remove the heat or ice right away to prevent skin damage. The risk of damage is higher if you cannot feel pain, heat, or cold. Activity Rest as told by your health care provider. Do not stay in bed. Staying in bed for more than 1-2 days  can delay your recovery. Return to your normal activities as told by your health care provider. Ask your health care provider what activities are safe for you. Avoid activities that take a lot of energy for as long as told by your health care provider. Do exercises as told by your health care provider. This includes stretching and strengthening exercises. General instructions      Use good posture when sitting and standing. Avoid leaning forward when you sit, and avoid hunching over when you stand. Do not use any products that contain nicotine or tobacco. These products include cigarettes, chewing tobacco, and vaping devices, such as e-cigarettes. If you need help quitting, ask your health care provider. How is this prevented?  Warm up properly before physical activity, and cool down and stretch after being active. Use correct form when playing sports. Bend your knees and use correct posture when lifting heavy objects. Maintain a healthy weight. Sleep on a mattress with medium firmness to support your back. Do at least 150 minutes of moderate-intensity exercise each week, such as brisk walking or water aerobics. Try a form of exercise that takes stress off your back, such as swimming or stationary cycling. Maintain physical fitness, including: Strength. Flexibility. Contact a health care provider if: Your back pain does not improve after several weeks of treatment. Your symptoms get worse. You have a fever. Get help right away if: Your back pain is severe. You cannot stand or walk. You feel nauseous or you vomit. You develop any of the following: Trouble controlling when you urinate or when you have a bowel movement. Pain in your legs. Your feet or legs get very cold, turn pale, or look blue. Weakness in your buttocks or legs. This information is not intended to replace advice given to you by your health care provider. Make sure you discuss any questions you have with your health care provider. Document Revised: 04/08/2023 Document Reviewed: 06/26/2022 Elsevier Patient Education  2024 [employer].

## 2023-12-30 ENCOUNTER — Other Ambulatory Visit: Payer: Self-pay | Admitting: Family Medicine

## 2024-01-03 ENCOUNTER — Encounter: Payer: Self-pay | Admitting: Family Medicine

## 2024-01-08 MED ORDER — TRIAMTERENE-HCTZ 37.5-25 MG PO TABS: 1.0000 | ORAL_TABLET | Freq: Every day | ORAL | 0 refills | Status: DC

## 2024-01-12 ENCOUNTER — Other Ambulatory Visit: Payer: Self-pay | Admitting: Dermatology

## 2024-01-12 DIAGNOSIS — L309 Dermatitis, unspecified: Secondary | ICD-10-CM

## 2024-03-10 ENCOUNTER — Ambulatory Visit (INDEPENDENT_AMBULATORY_CARE_PROVIDER_SITE_OTHER): Payer: No Typology Code available for payment source | Admitting: Dermatology

## 2024-03-10 DIAGNOSIS — L814 Other melanin hyperpigmentation: Secondary | ICD-10-CM

## 2024-03-10 DIAGNOSIS — B009 Herpesviral infection, unspecified: Secondary | ICD-10-CM

## 2024-03-10 DIAGNOSIS — D225 Melanocytic nevi of trunk: Secondary | ICD-10-CM

## 2024-03-10 DIAGNOSIS — W908XXA Exposure to other nonionizing radiation, initial encounter: Secondary | ICD-10-CM | POA: Diagnosis not present

## 2024-03-10 DIAGNOSIS — Z86006 Personal history of melanoma in-situ: Secondary | ICD-10-CM

## 2024-03-10 DIAGNOSIS — D229 Melanocytic nevi, unspecified: Secondary | ICD-10-CM

## 2024-03-10 DIAGNOSIS — D1801 Hemangioma of skin and subcutaneous tissue: Secondary | ICD-10-CM

## 2024-03-10 DIAGNOSIS — L720 Epidermal cyst: Secondary | ICD-10-CM

## 2024-03-10 DIAGNOSIS — B001 Herpesviral vesicular dermatitis: Secondary | ICD-10-CM

## 2024-03-10 DIAGNOSIS — L821 Other seborrheic keratosis: Secondary | ICD-10-CM

## 2024-03-10 DIAGNOSIS — L853 Xerosis cutis: Secondary | ICD-10-CM

## 2024-03-10 DIAGNOSIS — L820 Inflamed seborrheic keratosis: Secondary | ICD-10-CM | POA: Diagnosis not present

## 2024-03-10 DIAGNOSIS — L578 Other skin changes due to chronic exposure to nonionizing radiation: Secondary | ICD-10-CM | POA: Diagnosis not present

## 2024-03-10 DIAGNOSIS — R21 Rash and other nonspecific skin eruption: Secondary | ICD-10-CM

## 2024-03-10 DIAGNOSIS — Z1283 Encounter for screening for malignant neoplasm of skin: Secondary | ICD-10-CM | POA: Diagnosis not present

## 2024-03-10 DIAGNOSIS — Z85828 Personal history of other malignant neoplasm of skin: Secondary | ICD-10-CM

## 2024-03-10 MED ORDER — VALACYCLOVIR HCL 1 G PO TABS: 1000.0000 mg | ORAL_TABLET | Freq: Three times a day (TID) | ORAL | 0 refills | Status: DC

## 2024-03-10 MED ORDER — SULFAMETHOXAZOLE-TRIMETHOPRIM 800-160 MG PO TABS: ORAL_TABLET | ORAL | 0 refills | Status: DC

## 2024-03-10 NOTE — Patient Instructions (Addendum)
 Recommend starting moisturizer with exfoliant (Urea, Salicylic acid, or Lactic acid) one to two times daily to help smooth rough and bumpy skin.  OTC options include Cetaphil Rough and Bumpy lotion (Urea), Eucerin Roughness Relief lotion or spot treatment cream (Urea), CeraVe SA lotion/cream for Rough and Bumpy skin (Sal Acid), Gold Bond Rough and Bumpy cream (Sal Acid), and AmLactin 12% lotion/cream (Lactic Acid).  If applying in morning, also apply sunscreen to sun-exposed areas, since these exfoliating moisturizers can increase sensitivity to sun.   Gentle Skin Care Guide  1. Bathe no more than once a day.  2. Avoid bathing in hot water  3. Use a mild soap like Dove, Vanicream, Cetaphil, CeraVe. Can use Lever 2000 or Cetaphil antibacterial soap  4. Use soap only where you need it. On most days, use it under your arms, between your legs, and on your feet. Let the water rinse other areas unless visibly dirty.  5. When you get out of the bath/shower, use a towel to gently blot your skin dry, don't rub it.  6. While your skin is still a little damp, apply a moisturizing cream such as Vanicream, CeraVe, Cetaphil, Eucerin, Sarna lotion or plain Vaseline Jelly. For hands apply Neutrogena [place] Hand Cream or Excipial Hand Cream.  7. Reapply moisturizer any time you start to itch or feel dry.  8. Sometimes using free and clear laundry detergents can be helpful. Fabric softener sheets should be avoided. Downy Free & Gentle liquid, or any liquid fabric softener that is free of dyes and perfumes, it acceptable to use  9. If your doctor has given you prescription creams you may apply moisturizers over them     Due to recent changes in healthcare laws, you may see results of your pathology and/or laboratory studies on MyChart before the doctors have had a chance to review them. We understand that in some cases there may be results that are confusing or concerning to you. Please understand that not  all results are received at the same time and often the doctors may need to interpret multiple results in order to provide you with the best plan of care or course of treatment. Therefore, we ask that you please give [place] 2 business days to thoroughly review all your results before contacting the office for clarification. Should we see a critical lab result, you will be contacted sooner.   If You Need Anything After Your Visit  If you have any questions or concerns for your doctor, please call our main line at [phone] and press option 4 to reach your doctor's medical assistant. If no one answers, please leave a voicemail as directed and we will return your call as soon as possible. Messages left after 4 pm will be answered the following business day.   You may also send [place] a message via MyChart. We typically respond to MyChart messages within 1-2 business days.  For prescription refills, please ask your pharmacy to contact our office. Our fax number is [phone].  If you have an urgent issue when the clinic is closed that cannot wait until the next business day, you can page your doctor at the number below.    Please note that while we do our best to be available for urgent issues outside of office hours, we are not available 24/7.   If you have an urgent issue and are unable to reach [place], you may choose to seek medical care at your doctor's office, retail clinic, urgent care  center, or emergency room.  If you have a medical emergency, please immediately call 911 or go to the emergency department.  Pager Numbers  - Dr. Gwen Pounds: [phone]  - Dr. Roseanne Reno: [phone]  - Dr. Katrinka Blazing: [phone]   In the event of inclement weather, please call our main line at [phone] for an update on the status of any delays or closures.  Dermatology Medication Tips: Please keep the boxes that topical medications come in in order to help keep track of the instructions about where and how to  use these. Pharmacies typically print the medication instructions only on the boxes and not directly on the medication tubes.   If your medication is too expensive, please contact our office at [phone] option 4 or send [place] a message through MyChart.   We are unable to tell what your co-pay for medications will be in advance as this is different depending on your insurance coverage. However, we may be able to find a substitute medication at lower cost or fill out paperwork to get insurance to cover a needed medication.   If a prior authorization is required to get your medication covered by your insurance company, please allow [place] 1-2 business days to complete this process.  Drug prices often vary depending on where the prescription is filled and some pharmacies may offer cheaper prices.  The website www.goodrx.com contains coupons for medications through different pharmacies. The prices here do not account for what the cost may be with help from insurance (it may be cheaper with your insurance), but the website can give you the price if you did not use any insurance.  - You can print the associated coupon and take it with your prescription to the pharmacy.  - You may also stop by our office during regular business hours and pick up a GoodRx coupon card.  - If you need your prescription sent electronically to a different pharmacy, notify our office through [facility] or by phone at [phone] option 4.     Si Usted Necesita Algo Despus de Su Visita  Tambin puede enviarnos un mensaje a travs de Clinical cytogeneticist. Por lo general respondemos a los mensajes de MyChart en el transcurso de 1 a 2 das hbiles.  Para renovar recetas, por favor pida a su farmacia que se ponga en contacto con nuestra oficina. Annie Sable de fax es [place] [phone].  Si tiene un asunto urgente cuando la clnica est cerrada y que no puede esperar hasta el siguiente da hbil, puede llamar/localizar a su  doctor(a) al nmero que aparece a continuacin.   Por favor, tenga en cuenta que aunque hacemos todo lo posible para estar disponibles para asuntos urgentes fuera del horario de [place], no estamos disponibles las 24 horas del da, los 7 [address]  [address] de la [place].   Si tiene un problema urgente y no puede comunicarse con nosotros, puede optar por buscar atencin mdica  en el consultorio de su doctor(a), en una clnica privada, en un centro de atencin urgente o en una sala de emergencias.  Si tiene Engineer, drilling, por favor llame inmediatamente al 911 o vaya a la sala de emergencias.  Nmeros de bper  - Dr. Gwen Pounds: [phone]  - Dra. Roseanne Reno: [phone]  - Dr. Katrinka Blazing: [phone]   En caso de inclemencias del tiempo, por favor llame a Lacy Duverney principal al [phone] para una actualizacin sobre el [place] de cualquier retraso o cierre.  Consejos para la medicacin en dermatologa: Por favor, guarde las cajas  en las que vienen los medicamentos de uso tpico para ayudarle a seguir las instrucciones sobre dnde y cmo usarlos. Las farmacias generalmente imprimen las instrucciones del medicamento slo en las cajas y no directamente en los tubos del [place].   Si su medicamento es muy caro, por favor, pngase en contacto con Rolm Gala llamando al [phone] y presione la opcin 4 o envenos un mensaje a travs de Clinical cytogeneticist.   No podemos decirle cul ser su copago por los medicamentos por adelantado ya que esto es diferente dependiendo de la cobertura de su seguro. Sin embargo, es posible que podamos encontrar un medicamento sustituto a Audiological scientist un formulario para que el seguro cubra el medicamento que se considera necesario.   Si se requiere una autorizacin previa para que su compaa de seguros [place] su medicamento, por favor permtanos de 1 a 2 das hbiles para completar [address]  Los precios de los medicamentos varan con frecuencia dependiendo del  Environmental consultant de dnde se surte la receta y alguna farmacias pueden ofrecer precios ms baratos.  El sitio web www.goodrx.com tiene cupones para medicamentos de Health and safety inspector. Los precios aqu no tienen en cuenta lo que podra costar con la ayuda del seguro (puede ser ms barato con su seguro), pero el sitio web puede darle el precio si no utiliz Tourist information centre manager.  - Puede imprimir el cupn correspondiente y llevarlo con su receta a la farmacia.  - Tambin puede pasar por nuestra oficina durante el horario de atencin regular y Education officer, museum una tarjeta de cupones de GoodRx.  - Si necesita que su receta se enve electrnicamente a una farmacia diferente, informe a nuestra oficina a travs de MyChart de [place] o por telfono llamando al [phone] y presione la opcin 4.

## 2024-03-10 NOTE — Progress Notes (Signed)
 Follow-Up Visit   Subjective  Allison Howard is a 58 y.o. female who presents for the following: Skin Cancer Screening and Full Body Skin Exam  Severe sunburn of the face - currently using Elidel cream, has caused fever blisters around the mouth, currently using Abreva. Pt's husband noticed a dark crusty lesion on the back that she would like checked today. Rosacea - pt did not see an improvement with Metronidazole so she stopped using it. She has noticed that rosacea flares significantly when she drinks alcohol.   The patient presents for Total-Body Skin Exam (TBSE) for skin cancer screening and mole check. The patient has spots, moles and lesions to be evaluated, some may be new or changing and the patient may have concern these could be cancer.  The following portions of the chart were reviewed this encounter and updated as appropriate: medications, allergies, medical history  Review of Systems:  No other skin or systemic complaints except as noted in HPI or Assessment and Plan.  Objective  Well appearing patient in no apparent distress; mood and affect are within normal limits.  A full examination was performed including scalp, head, eyes, ears, nose, lips, neck, chest, axillae, abdomen, back, buttocks, bilateral upper extremities, bilateral lower extremities, hands, feet, fingers, toes, fingernails, and toenails. All findings within normal limits unless otherwise noted below.   Relevant physical exam findings are noted in the Assessment and Plan. mid sternum x 1, L upper back x 1, L hand dorsum x 1, R foot dorsum x 2, R palm x 1 Erythematous stuck-on, waxy papule or plaque  Assessment & Plan   SKIN CANCER SCREENING PERFORMED TODAY.  ACTINIC DAMAGE - Chronic condition, secondary to cumulative UV/sun exposure - diffuse scaly erythematous macules with underlying dyspigmentation - Recommend daily broad spectrum sunscreen SPF 30+ to sun-exposed areas, reapply every 2 hours as  needed.  - Staying in the shade or wearing long sleeves, sun glasses (UVA+UVB protection) and wide brim hats (4-inch brim around the entire circumference of the hat) are also recommended for sun protection.  - Call for new or changing lesions.  LENTIGINES, SEBORRHEIC KERATOSES, HEMANGIOMAS - Benign normal skin lesions - Benign-appearing - Call for any changes  MELANOCYTIC NEVI - 4.0 mm med dark brown macule at right spinal lower back, unchanged when compared to photo - 6 x 3 mm brown macule at right flank, unchanged when compared to photo - 4 mm x 2 brown macules slight irregular pigment L spinal upper back , unchanged when compared to photo - Brown macule and papules trunk, photos compared, no changes - Tan-brown and/or pink-flesh-colored symmetric macules and papules - Benign appearing on exam today - Observation - Call clinic for new or changing moles - Recommend daily use of broad spectrum spf 30+ sunscreen to sun-exposed areas.   History of Squamous Cell Carcinoma of the Skin - Left pretibia clear with biopsy (10/30/2021). Right upper lip, clear (Mohs 01/01/2022  - No evidence of recurrence today, crusting at R upper lip due to recent sunburn - Recommend regular full body skin exams - Recommend daily broad spectrum sunscreen SPF 30+ to sun-exposed areas, reapply every 2 hours as needed.  - Call if any new or changing lesions are noted between office visits   History of Melanoma in Situ- 08/2021 - No evidence of recurrence today at left lower back - Recommend regular full body skin exams - Recommend daily broad spectrum sunscreen SPF 30+ to sun-exposed areas, reapply every 2 hours as needed.  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   Exam: Crusted vesicles at Left lower lip, and at left forehead, painful per pt  Chronic  and persistent condition with duration or expected duration over one year. Condition is bothersome/symptomatic for patient. Currently flared.   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  It is infectious and can be spread from person to person by direct contact.  It is not curable, but is treatable with topical and oral medication.  Treatment Plan HSV/VZV PCR samples obtained today, bacterial culture obtained start Valacyclovir 1 grams every TID x 7 days. Start Mupirocin 2% ointment to aa's BID.  Ok to continue Abreva OTC. Start Bactrim DS BID x 7 days. Recommend Eucerin Hydrogel moisturizer use daily. RASH AND OTHER NONSPECIFIC SKIN ERUPTION   Related Procedures HSV and VZV PCR Panel HSV and VZV PCR Panel Anaerobic and Aerobic Culture INFLAMED SEBORRHEIC KERATOSIS mid sternum x 1, L upper back x 1, L hand dorsum x 1, R foot dorsum x 2, R palm x 1 Symptomatic, irritating, patient would like treated. R palm ISK vs wart.  Destruction of lesion - mid sternum x 1, L upper back x 1, L hand dorsum x 1, R foot dorsum x 2, R palm x 1  Destruction method: cryotherapy   Informed consent: discussed and consent obtained   Lesion destroyed using liquid nitrogen: Yes   Region frozen until ice ball extended beyond lesion: Yes   Outcome: patient tolerated procedure well with no complications   Post-procedure details: wound care instructions given    Xerosis - diffuse xerotic patches - recommend gentle, hydrating skin care - gentle skin care handout given - Recommend starting moisturizer with exfoliant (Urea, Salicylic acid, or Lactic acid) one to two times daily to help smooth rough and bumpy skin.  OTC options include Cetaphil Rough and Bumpy lotion (Urea), Eucerin Roughness Relief lotion or spot treatment cream (Urea),  CeraVe SA lotion/cream for Rough and Bumpy skin (Sal Acid), Gold Bond Rough and Bumpy cream (Sal Acid), and AmLactin 12% lotion/cream (Lactic Acid).  If applying in morning, also apply sunscreen to sun-exposed areas, since these exfoliating moisturizers can increase sensitivity to sun.  Milia vs sebaceous hyperplasia - R med cheek - 2.0 mm firm white papule - type of cyst - benign - sometimes these will clear with nightly OTC adapalene/Differin 0.1% gel or retinol. - may be extracted if symptomatic - observe  Return in about 6 months (around 09/10/2024) for TBSE - hx MM, SCC, AK; recheck face and treat R med cheek in 2 weeks.  Maylene Roes, CMA, am acting as scribe for Willeen Niece, MD .  Documentation: I have reviewed the above documentation for accuracy and completeness, and I agree with the above.  Willeen Niece, MD

## 2024-03-12 ENCOUNTER — Telehealth: Payer: Self-pay

## 2024-03-12 LAB — HSV AND VZV PCR PANEL
HSV 2 DNA: NEGATIVE
HSV 2 DNA: NEGATIVE
HSV-1 DNA: POSITIVE — AB
HSV-1 DNA: POSITIVE — AB
Varicella-Zoster, PCR: NEGATIVE
Varicella-Zoster, PCR: NEGATIVE

## 2024-03-12 MED ORDER — VALACYCLOVIR HCL 1 G PO TABS: ORAL_TABLET | ORAL | 11 refills | Status: AC

## 2024-03-12 NOTE — Telephone Encounter (Signed)
-----   Message from Willeen Niece sent at 03/12/2024  1:40 PM EDT ----- No VZV (shingles) detected. 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  Pt is on valacyclovir 1 gm tid for 1 week, but should also have an Rx that she can take when she feels a cold sore coming on, 2 gm PO bid x 1 day with onset of symptoms. - please call patient

## 2024-03-12 NOTE — Telephone Encounter (Signed)
 Patient informed of culture results and prescription sent to pharmacy.

## 2024-03-15 LAB — ANAEROBIC AND AEROBIC CULTURE

## 2024-03-16 ENCOUNTER — Telehealth: Payer: Self-pay

## 2024-03-16 NOTE — Telephone Encounter (Signed)
 Advised pt of culture results.  Discussed valtrex for cold sores.  Refills of valtrex for cold sores were sent in on 03/12/24.  Patient cancelled 2 wk f/u and said her face was clear./sh

## 2024-03-16 NOTE — Telephone Encounter (Signed)
-----   Message from Willeen Niece sent at 03/16/2024 10:32 AM EDT ----- Negative bacterial culture. - please call patient

## 2024-03-25 ENCOUNTER — Ambulatory Visit: Admitting: Dermatology

## 2024-03-26 ENCOUNTER — Other Ambulatory Visit: Payer: Self-pay | Admitting: Family Medicine

## 2024-05-03 ENCOUNTER — Other Ambulatory Visit: Payer: Self-pay | Admitting: Family Medicine

## 2024-05-25 ENCOUNTER — Other Ambulatory Visit: Payer: Self-pay | Admitting: Family Medicine

## 2024-05-25 ENCOUNTER — Other Ambulatory Visit: Payer: Self-pay

## 2024-05-25 ENCOUNTER — Ambulatory Visit (INDEPENDENT_AMBULATORY_CARE_PROVIDER_SITE_OTHER): Admitting: Family Medicine

## 2024-05-25 ENCOUNTER — Telehealth: Payer: Self-pay

## 2024-05-25 ENCOUNTER — Encounter: Payer: Self-pay | Admitting: Family Medicine

## 2024-05-25 VITALS — BP 110/76 | HR 66 | Temp 98.5°F | Ht 64.0 in | Wt 182.0 lb

## 2024-05-25 DIAGNOSIS — Z131 Encounter for screening for diabetes mellitus: Secondary | ICD-10-CM | POA: Diagnosis not present

## 2024-05-25 DIAGNOSIS — Z0000 Encounter for general adult medical examination without abnormal findings: Secondary | ICD-10-CM | POA: Diagnosis not present

## 2024-05-25 DIAGNOSIS — E876 Hypokalemia: Secondary | ICD-10-CM

## 2024-05-25 LAB — CBC WITH DIFFERENTIAL/PLATELET
Basophils Absolute: 0.1 10*3/uL (ref 0.0–0.1)
Basophils Relative: 0.9 % (ref 0.0–3.0)
Eosinophils Absolute: 0.4 10*3/uL (ref 0.0–0.7)
Eosinophils Relative: 5.3 % — ABNORMAL HIGH (ref 0.0–5.0)
HCT: 44.4 % (ref 36.0–46.0)
Hemoglobin: 15.4 g/dL — ABNORMAL HIGH (ref 12.0–15.0)
Lymphocytes Relative: 31.5 % (ref 12.0–46.0)
Lymphs Abs: 2.2 10*3/uL (ref 0.7–4.0)
MCHC: 34.7 g/dL (ref 30.0–36.0)
MCV: 86.1 fl (ref 78.0–100.0)
Monocytes Absolute: 0.4 10*3/uL (ref 0.1–1.0)
Monocytes Relative: 5.3 % (ref 3.0–12.0)
Neutro Abs: 4 10*3/uL (ref 1.4–7.7)
Neutrophils Relative %: 57 % (ref 43.0–77.0)
Platelets: 349 10*3/uL (ref 150.0–400.0)
RBC: 5.15 Mil/uL — ABNORMAL HIGH (ref 3.87–5.11)
RDW: 13.7 % (ref 11.5–15.5)
WBC: 7 10*3/uL (ref 4.0–10.5)

## 2024-05-25 LAB — BASIC METABOLIC PANEL WITH GFR
BUN: 22 mg/dL (ref 6–23)
CO2: 37 meq/L — ABNORMAL HIGH (ref 19–32)
Calcium: 10.7 mg/dL — ABNORMAL HIGH (ref 8.4–10.5)
Chloride: 86 meq/L — ABNORMAL LOW (ref 96–112)
Creatinine, Ser: 0.9 mg/dL (ref 0.40–1.20)
GFR: 71.71 mL/min (ref >60.00)
Glucose, Bld: 140 mg/dL — ABNORMAL HIGH (ref 70–99)
Potassium: 2.6 meq/L — CL (ref 3.5–5.1)
Sodium: 136 meq/L (ref 135–145)

## 2024-05-25 LAB — HEPATIC FUNCTION PANEL
ALT: 30 U/L (ref 0–35)
AST: 21 U/L (ref 0–37)
Albumin: 4.9 g/dL (ref 3.5–5.2)
Alkaline Phosphatase: 93 U/L (ref 39–117)
Bilirubin, Direct: 0.2 mg/dL (ref 0.0–0.3)
Total Bilirubin: 1 mg/dL (ref 0.2–1.2)
Total Protein: 8.6 g/dL — ABNORMAL HIGH (ref 6.0–8.3)

## 2024-05-25 LAB — LIPID PANEL
Cholesterol: 286 mg/dL — ABNORMAL HIGH (ref 0–200)
HDL: 69.9 mg/dL (ref >39.00)
LDL Cholesterol: 186 mg/dL — ABNORMAL HIGH (ref 0–99)
NonHDL: 215.91
Total CHOL/HDL Ratio: 4
Triglycerides: 152 mg/dL — ABNORMAL HIGH (ref 0.0–149.0)
VLDL: 30.4 mg/dL (ref 0.0–40.0)

## 2024-05-25 LAB — HEMOGLOBIN A1C: Hgb A1c MFr Bld: 6.6 % — ABNORMAL HIGH (ref 4.6–6.5)

## 2024-05-25 MED ORDER — TIRZEPATIDE-WEIGHT MANAGEMENT 2.5 MG/0.5ML ~~LOC~~ SOLN: 2.5000 mg | SUBCUTANEOUS | 2 refills | Status: DC

## 2024-05-25 MED ORDER — POTASSIUM CHLORIDE CRYS ER 20 MEQ PO TBCR: 20.0000 meq | EXTENDED_RELEASE_TABLET | Freq: Two times a day (BID) | ORAL | 5 refills | Status: DC

## 2024-05-25 NOTE — Telephone Encounter (Signed)
 Spoke with Allison Howard advised of potassium level being too low, aware that Dr Alyne Babinski advise to take Rx and have BMP Lab repeated in 2 weeks,Allison Howard has a lab appointment scheduled for 06/08/24. Allison Howard stated that she has plenty of the potassium capsules and does not need some right now. New Rx held until further notice.Allison Howard pharmacy notified

## 2024-05-25 NOTE — Telephone Encounter (Signed)
 We are still waiting for the full report, but please call in Klor-con  20 mEq to take one tablet BID, #60 with 5 rf. Recheck a BMET in 2 weeks

## 2024-05-25 NOTE — Progress Notes (Signed)
   Subjective:    Patient ID: Allison Howard, female    DOB: 04/21/1966, 58 y.o.   MRN: [id number]  HPI Here for a well exam. She feels fine but she asks for help losing weight. She exercises and she eats a healthy diet, but she has been unable to lose weight.    Review of Systems  Constitutional: Negative.   HENT: Negative.    Eyes: Negative.   Respiratory: Negative.    Cardiovascular: Negative.   Gastrointestinal: Negative.   Genitourinary:  Negative for decreased urine volume, difficulty urinating, dyspareunia, dysuria, enuresis, flank pain, frequency, hematuria, pelvic pain and urgency.  Musculoskeletal: Negative.   Skin: Negative.   Neurological: Negative.  Negative for headaches.  Psychiatric/Behavioral: Negative.         Objective:   Physical Exam Constitutional:      General: She is not in acute distress.    Appearance: She is well-developed. She is obese.  HENT:     Head: Normocephalic and atraumatic.     Right Ear: External ear normal.     Left Ear: External ear normal.     Nose: Nose normal.     Mouth/Throat:     Pharynx: No oropharyngeal exudate.  Eyes:     General: No scleral icterus.    Conjunctiva/sclera: Conjunctivae normal.     Pupils: Pupils are equal, round, and reactive to light.  Neck:     Thyroid : No thyromegaly.     Vascular: No JVD.  Cardiovascular:     Rate and Rhythm: Normal rate and regular rhythm.     Pulses: Normal pulses.     Heart sounds: Normal heart sounds. No murmur heard.    No friction rub. No gallop.  Pulmonary:     Effort: Pulmonary effort is normal. No respiratory distress.     Breath sounds: Normal breath sounds. No wheezing or rales.  Chest:     Chest wall: No tenderness.  Abdominal:     General: Bowel sounds are normal. There is no distension.     Palpations: Abdomen is soft. There is no mass.     Tenderness: There is no abdominal tenderness. There is no guarding or rebound.  Musculoskeletal:        General: No  tenderness. Normal range of motion.     Cervical back: Normal range of motion and neck supple.  Lymphadenopathy:     Cervical: No cervical adenopathy.  Skin:    General: Skin is warm and dry.     Findings: No erythema or rash.  Neurological:     General: No focal deficit present.     Mental Status: She is alert and oriented to person, place, and time.     Cranial Nerves: No cranial nerve deficit.     Motor: No abnormal muscle tone.     Coordination: Coordination normal.     Deep Tendon Reflexes: Reflexes are normal and symmetric. Reflexes normal.  Psychiatric:        Mood and Affect: Mood normal.        Behavior: Behavior normal.        Thought Content: Thought content normal.        Judgment: Judgment normal.           Assessment & Plan:  Well exam. We discussed diet and exercise. Get fasting labs. She will start on Zepbound 0.25 mg weekly. Report back in 4 weeks.  Corita Diego, MD

## 2024-05-25 NOTE — Addendum Note (Signed)
 Addended by: Philbert Brave on: 05/25/2024 04:41 PM   Modules accepted: Orders

## 2024-05-25 NOTE — Telephone Encounter (Signed)
 Message sent to PCP for advise ?

## 2024-05-26 MED ORDER — WEGOVY 0.25 MG/0.5ML ~~LOC~~ SOAJ: 0.2500 mg | SUBCUTANEOUS | 5 refills | Status: DC

## 2024-05-26 NOTE — Addendum Note (Signed)
 Addended by: Corita Diego A on: 05/26/2024 07:55 AM   Modules accepted: Orders

## 2024-05-27 ENCOUNTER — Telehealth: Payer: Self-pay

## 2024-05-27 ENCOUNTER — Other Ambulatory Visit (HOSPITAL_COMMUNITY): Payer: Self-pay

## 2024-05-27 NOTE — Telephone Encounter (Signed)
 Pharmacy Patient Advocate Encounter   Received notification from Onbase that prior authorization for [facility] 0.25MG /0.5ML auto-injectors  is required/requested.   Insurance verification completed.   The patient is insured through [facility] .   Per test claim: PA required; PA submitted to above mentioned insurance via CoverMyMeds Key/confirmation #/EOC [id number] Status is pending

## 2024-05-27 NOTE — Telephone Encounter (Signed)
 Pharmacy Patient Advocate Encounter  Received notification from [employer] that Prior Authorization for [facility] 0.25MG /0.5ML auto-injectors  has been APPROVED from 05/27/24 to 09/26/24. Ran test claim, Copay is $24.99. This test claim was processed through [facility]- copay amounts may vary at other pharmacies due to pharmacy/plan contracts, or as the patient moves through the different stages of their insurance plan.   PA #/Case ID/Reference #: [id number]  *LVM with [facility] to process

## 2024-05-28 LAB — TSH: TSH: 2.07 u[IU]/mL (ref 0.35–5.50)

## 2024-05-29 ENCOUNTER — Ambulatory Visit: Payer: Self-pay | Admitting: Family Medicine

## 2024-05-29 DIAGNOSIS — E876 Hypokalemia: Secondary | ICD-10-CM

## 2024-06-08 ENCOUNTER — Other Ambulatory Visit (INDEPENDENT_AMBULATORY_CARE_PROVIDER_SITE_OTHER): Payer: Self-pay

## 2024-06-08 ENCOUNTER — Encounter: Payer: Self-pay | Admitting: Family Medicine

## 2024-06-08 DIAGNOSIS — E876 Hypokalemia: Secondary | ICD-10-CM

## 2024-06-08 LAB — BASIC METABOLIC PANEL WITH GFR
BUN: 24 mg/dL — ABNORMAL HIGH (ref 6–23)
CO2: 33 meq/L — ABNORMAL HIGH (ref 19–32)
Calcium: 10.1 mg/dL (ref 8.4–10.5)
Chloride: 93 meq/L — ABNORMAL LOW (ref 96–112)
Creatinine, Ser: 0.97 mg/dL (ref 0.40–1.20)
GFR: 65.53 mL/min (ref >60.00)
Glucose, Bld: 112 mg/dL — ABNORMAL HIGH (ref 70–99)
Potassium: 3.4 meq/L — ABNORMAL LOW (ref 3.5–5.1)
Sodium: 136 meq/L (ref 135–145)

## 2024-06-08 MED ORDER — POTASSIUM CHLORIDE ER 10 MEQ PO CPCR: 10.0000 meq | ORAL_CAPSULE | Freq: Two times a day (BID) | ORAL | 3 refills | Status: DC

## 2024-06-08 NOTE — Telephone Encounter (Signed)
 Pt level was rechecked this morning and the levels are still low, pt is requesting for Potassium capsules if Dr Johnny advise to continue with potassium. Please advise

## 2024-06-08 NOTE — Telephone Encounter (Signed)
 I sent in a RX for potassium capsules

## 2024-06-09 ENCOUNTER — Encounter: Payer: Self-pay | Admitting: Family Medicine

## 2024-06-09 ENCOUNTER — Other Ambulatory Visit: Payer: Self-pay | Admitting: Family Medicine

## 2024-06-09 MED ORDER — POTASSIUM CHLORIDE CRYS ER 10 MEQ PO TBCR: EXTENDED_RELEASE_TABLET | ORAL | 1 refills | Status: DC

## 2024-06-15 ENCOUNTER — Other Ambulatory Visit: Payer: Self-pay | Admitting: Family Medicine

## 2024-06-15 ENCOUNTER — Telehealth: Payer: Self-pay

## 2024-06-15 NOTE — Telephone Encounter (Signed)
 Copied from CRM [id number]. Topic: Clinical - Prescription Issue >> Jun 15, 2024  4:41 PM Wess RAMAN wrote: Reason for CRM: Patient needs more potassium chloride  (MICRO-K ) 10 MEQ CR capsule due to the dosage increasing to twice daily.  Callback #: [id number]  [facility]: [facility] [address], [place] - [address] [place] [address] Phone: [phone] Fax: [phone] Hours: Not open 24 hours

## 2024-06-16 NOTE — Telephone Encounter (Signed)
 Spoke with pt pharmacy state that already pick up Rx

## 2024-06-30 ENCOUNTER — Other Ambulatory Visit: Payer: Self-pay

## 2024-06-30 DIAGNOSIS — Z1231 Encounter for screening mammogram for malignant neoplasm of breast: Secondary | ICD-10-CM

## 2024-07-06 NOTE — Telephone Encounter (Signed)
 Noted

## 2024-07-14 ENCOUNTER — Ambulatory Visit: Payer: Self-pay | Admitting: Family Medicine

## 2024-07-14 ENCOUNTER — Other Ambulatory Visit (INDEPENDENT_AMBULATORY_CARE_PROVIDER_SITE_OTHER)

## 2024-07-14 ENCOUNTER — Other Ambulatory Visit: Payer: Self-pay | Admitting: Family Medicine

## 2024-07-14 DIAGNOSIS — E876 Hypokalemia: Secondary | ICD-10-CM

## 2024-07-14 LAB — BASIC METABOLIC PANEL WITH GFR
BUN: 18 mg/dL (ref 6–23)
CO2: 35 meq/L — ABNORMAL HIGH (ref 19–32)
Calcium: 10.1 mg/dL (ref 8.4–10.5)
Chloride: 87 meq/L — ABNORMAL LOW (ref 96–112)
Creatinine, Ser: 0.88 mg/dL (ref 0.40–1.20)
GFR: 73.6 mL/min (ref >60.00)
Glucose, Bld: 108 mg/dL — ABNORMAL HIGH (ref 70–99)
Potassium: 3.3 meq/L — ABNORMAL LOW (ref 3.5–5.1)
Sodium: 136 meq/L (ref 135–145)

## 2024-07-20 ENCOUNTER — Other Ambulatory Visit: Payer: Self-pay | Admitting: Family Medicine

## 2024-07-20 NOTE — Telephone Encounter (Unsigned)
 Copied from CRM #[id number]. Topic: Clinical - Medication Refill >> Jul 20, 2024 12:12 PM Aisha D wrote: Medication: potassium chloride  (MICRO-K ) 10 MEQ CR capsule  Has the patient contacted their pharmacy? Yes (Agent: If no, request that the patient contact the pharmacy for the refill. If patient does not wish to contact the pharmacy document the reason why and proceed with request.) (Agent: If yes, when and what did the pharmacy advise?)  This is the patient's preferred pharmacy:  [facility] [address] [place] - [address] [place] [address] Phone: [phone] Fax: [phone]  Is this the correct pharmacy for this prescription? Yes If no, delete pharmacy and type the correct one.   Has the prescription been filled recently? No  Is the patient out of the medication? No, 2 days left  Has the patient been seen for an appointment in the last year OR does the patient have an upcoming appointment? Yes  Can we respond through MyChart? Yes  Agent: Please be advised that Rx refills may take up to 3 business days. We ask that you follow-up with your pharmacy.

## 2024-07-27 ENCOUNTER — Encounter: Payer: Self-pay | Admitting: Family Medicine

## 2024-07-28 ENCOUNTER — Ambulatory Visit
Admission: RE | Admit: 2024-07-28 | Discharge: 2024-07-28 | Disposition: A | Discharge status: Home / Self Care | Source: Ambulatory Visit

## 2024-07-28 DIAGNOSIS — Z1231 Encounter for screening mammogram for malignant neoplasm of breast: Secondary | ICD-10-CM | POA: Diagnosis present

## 2024-07-30 ENCOUNTER — Other Ambulatory Visit: Payer: Self-pay

## 2024-07-30 DIAGNOSIS — R928 Other abnormal and inconclusive findings on diagnostic imaging of breast: Secondary | ICD-10-CM

## 2024-07-31 ENCOUNTER — Ambulatory Visit
Admission: RE | Admit: 2024-07-31 | Discharge: 2024-07-31 | Disposition: A | Discharge status: Home / Self Care | Source: Ambulatory Visit

## 2024-07-31 DIAGNOSIS — R928 Other abnormal and inconclusive findings on diagnostic imaging of breast: Secondary | ICD-10-CM

## 2024-08-05 ENCOUNTER — Other Ambulatory Visit: Payer: Self-pay | Admitting: Family Medicine

## 2024-08-10 ENCOUNTER — Ambulatory Visit: Admitting: Dermatology

## 2024-08-10 DIAGNOSIS — L720 Epidermal cyst: Secondary | ICD-10-CM | POA: Diagnosis not present

## 2024-08-10 DIAGNOSIS — Z1283 Encounter for screening for malignant neoplasm of skin: Secondary | ICD-10-CM | POA: Diagnosis not present

## 2024-08-10 DIAGNOSIS — D229 Melanocytic nevi, unspecified: Secondary | ICD-10-CM

## 2024-08-10 DIAGNOSIS — D489 Neoplasm of uncertain behavior, unspecified: Secondary | ICD-10-CM

## 2024-08-10 DIAGNOSIS — L570 Actinic keratosis: Secondary | ICD-10-CM | POA: Diagnosis not present

## 2024-08-10 DIAGNOSIS — W908XXA Exposure to other nonionizing radiation, initial encounter: Secondary | ICD-10-CM | POA: Diagnosis not present

## 2024-08-10 DIAGNOSIS — L309 Dermatitis, unspecified: Secondary | ICD-10-CM

## 2024-08-10 DIAGNOSIS — C44722 Squamous cell carcinoma of skin of right lower limb, including hip: Secondary | ICD-10-CM

## 2024-08-10 DIAGNOSIS — L820 Inflamed seborrheic keratosis: Secondary | ICD-10-CM

## 2024-08-10 DIAGNOSIS — D485 Neoplasm of uncertain behavior of skin: Secondary | ICD-10-CM

## 2024-08-10 DIAGNOSIS — Z86006 Personal history of melanoma in-situ: Secondary | ICD-10-CM

## 2024-08-10 DIAGNOSIS — L578 Other skin changes due to chronic exposure to nonionizing radiation: Secondary | ICD-10-CM

## 2024-08-10 DIAGNOSIS — L821 Other seborrheic keratosis: Secondary | ICD-10-CM

## 2024-08-10 DIAGNOSIS — Z85828 Personal history of other malignant neoplasm of skin: Secondary | ICD-10-CM

## 2024-08-10 DIAGNOSIS — L814 Other melanin hyperpigmentation: Secondary | ICD-10-CM

## 2024-08-10 DIAGNOSIS — D225 Melanocytic nevi of trunk: Secondary | ICD-10-CM

## 2024-08-10 NOTE — Patient Instructions (Addendum)
 Recommend starting moisturizer with exfoliant (Urea, Salicylic acid, or Lactic acid) one to two times daily to help smooth rough and bumpy skin.  OTC options include Cetaphil Rough and Bumpy lotion (Urea), Eucerin Roughness Relief lotion or spot treatment cream (Urea), CeraVe SA lotion/cream for Rough and Bumpy skin (Sal Acid), Gold Bond Rough and Bumpy cream (Sal Acid), and AmLactin 12% lotion/cream (Lactic Acid).  If applying in morning, also apply sunscreen to sun-exposed areas, since these exfoliating moisturizers can increase sensitivity to sun.    Actinic keratoses are precancerous spots that appear secondary to cumulative UV radiation exposure/sun exposure over time. They are chronic with expected duration over 1 year. A portion of actinic keratoses will progress to squamous cell carcinoma of the skin. It is not possible to reliably predict which spots will progress to skin cancer and so treatment is recommended to prevent development of skin cancer.  Recommend daily broad spectrum sunscreen SPF 30+ to sun-exposed areas, reapply every 2 hours as needed.  Recommend staying in the shade or wearing long sleeves, sun glasses (UVA+UVB protection) and wide brim hats (4-inch brim around the entire circumference of the hat). Call for new or changing lesions.    Cryotherapy Aftercare  Wash gently with soap and water everyday.   Apply Vaseline and Band-Aid daily until healed.   Seborrheic Keratosis  What causes seborrheic keratoses? Seborrheic keratoses are harmless, common skin growths that first appear during adult life.  As time goes by, more growths appear.  Some people may develop a large number of them.  Seborrheic keratoses appear on both covered and uncovered body parts.  They are not caused by sunlight.  The tendency to develop seborrheic keratoses can be inherited.  They vary in color from skin-colored to gray, brown, or even black.  They can be either smooth or have a rough, warty surface.    Seborrheic keratoses are superficial and look as if they were stuck on the skin.  Under the microscope this type of keratosis looks like layers upon layers of skin.  That is why at times the top layer may seem to fall off, but the rest of the growth remains and re-grows.    Treatment Seborrheic keratoses do not need to be treated, but can easily be removed in the office.  Seborrheic keratoses often cause symptoms when they rub on clothing or jewelry.  Lesions can be in the way of shaving.  If they become inflamed, they can cause itching, soreness, or burning.  Removal of a seborrheic keratosis can be accomplished by freezing, burning, or surgery. If any spot bleeds, scabs, or grows rapidly, please return to have it checked, as these can be an indication of a skin cancer.   Electrodesiccation and Curettage ("Scrape and Burn") Wound Care Instructions  Leave the original bandage on for 24 hours if possible.  If the bandage becomes soaked or soiled before that time, it is OK to remove it and examine the wound.  A small amount of post-operative bleeding is normal.  If excessive bleeding occurs, remove the bandage, place gauze over the site and apply continuous pressure (no peeking) over the area for 30 minutes. If this does not work, please call our clinic as soon as possible or page your doctor if it is after hours.   Once a day, cleanse the wound with soap and water. It is fine to shower. If a thick crust develops you may use a Q-tip dipped into dilute hydrogen peroxide (mix 1:1 with water)  to dissolve it.  Hydrogen peroxide can slow the healing process, so use it only as needed.    After washing, apply petroleum jelly (Vaseline) or an antibiotic ointment if your doctor prescribed one for you, followed by a bandage.    For best healing, the wound should be covered with a layer of ointment at all times. If you are not able to keep the area covered with a bandage to hold the ointment in place, this may  mean re-applying the ointment several times a day.  Continue this wound care until the wound has healed and is no longer open. It may take several weeks for the wound to heal and close.  Itching and mild discomfort is normal during the healing process.  If you have any discomfort, you can take Tylenol  (acetaminophen ) or ibuprofen  as directed on the bottle. (Please do not take these if you have an allergy to them or cannot take them for another reason).  Some redness, tenderness and white or yellow material in the wound is normal healing.  If the area becomes very sore and red, or develops a thick yellow-green material (pus), it may be infected; please notify us .    Wound healing continues for up to one year following surgery. It is not unusual to experience pain in the scar from time to time during the interval.  If the pain becomes severe or the scar thickens, you should notify the office.    A slight amount of redness in a scar is expected for the first six months.  After six months, the redness will fade and the scar will soften and fade.  The color difference becomes less noticeable with time.  If there are any problems, return for a post-op surgery check at your earliest convenience.  To improve the appearance of the scar, you can use silicone scar gel, cream, or sheets (such as Mederma or Serica) every night for up to one year. These are available over the counter (without a prescription).  Please call our office at [phone] for any questions or concerns.  Biopsy Wound Care Instructions  Leave the original bandage on for 24 hours if possible.  If the bandage becomes soaked or soiled before that time, it is OK to remove it and examine the wound.  A small amount of post-operative bleeding is normal.  If excessive bleeding occurs, remove the bandage, place gauze over the site and apply continuous pressure (no peeking) over the area for 30 minutes. If this does not work, please call our  clinic as soon as possible or page your doctor if it is after hours.   Once a day, cleanse the wound with soap and water. It is fine to shower. If a thick crust develops you may use a Q-tip dipped into dilute hydrogen peroxide (mix 1:1 with water) to dissolve it.  Hydrogen peroxide can slow the healing process, so use it only as needed.    After washing, apply petroleum jelly (Vaseline) or an antibiotic ointment if your doctor prescribed one for you, followed by a bandage.    For best healing, the wound should be covered with a layer of ointment at all times. If you are not able to keep the area covered with a bandage to hold the ointment in place, this may mean re-applying the ointment several times a day.  Continue this wound care until the wound has healed and is no longer open.   Itching and mild discomfort is normal during the  healing process. However, if you develop pain or severe itching, please call our office.   If you have any discomfort, you can take Tylenol  (acetaminophen ) or ibuprofen  as directed on the bottle. (Please do not take these if you have an allergy to them or cannot take them for another reason).  Some redness, tenderness and white or yellow material in the wound is normal healing.  If the area becomes very sore and red, or develops a thick yellow-green material (pus), it may be infected; please notify us .    If you have stitches, return to clinic as directed to have the stitches removed. You will continue wound care for 2-3 days after the stitches are removed.   Wound healing continues for up to one year following surgery. It is not unusual to experience pain in the scar from time to time during the interval.  If the pain becomes severe or the scar thickens, you should notify the office.    A slight amount of redness in a scar is expected for the first six months.  After six months, the redness will fade and the scar will soften and fade.  The color difference becomes less  noticeable with time.  If there are any problems, return for a post-op surgery check at your earliest convenience.  To improve the appearance of the scar, you can use silicone scar gel, cream, or sheets (such as Mederma or Serica) every night for up to one year. These are available over the counter (without a prescription).  Please call our office at [phone] for any questions or concerns.      Melanoma ABCDEs  Melanoma is the most dangerous type of skin cancer, and is the leading cause of death from skin disease.  You are more likely to develop melanoma if you: Have light-colored skin, light-colored eyes, or red or blond hair Spend a lot of time in the sun Tan regularly, either outdoors or in a tanning bed Have had blistering sunburns, especially during childhood Have a close family member who has had a melanoma Have atypical moles or large birthmarks  Early detection of melanoma is key since treatment is typically straightforward and cure rates are extremely high if we catch it early.   The first sign of melanoma is often a change in a mole or a new dark spot.  The ABCDE system is a way of remembering the signs of melanoma.  A for asymmetry:  The two halves do not match. B for border:  The edges of the growth are irregular. C for color:  A mixture of colors are present instead of an even brown color. D for diameter:  Melanomas are usually (but not always) greater than 6mm - the size of a pencil eraser. E for evolution:  The spot keeps changing in size, shape, and color.  Please check your skin once per month between visits. You can use a small mirror in front and a large mirror behind you to keep an eye on the back side or your body.   If you see any new or changing lesions before your next follow-up, please call to schedule a visit.  Please continue daily skin protection including broad spectrum sunscreen SPF 30+ to sun-exposed areas, reapplying every 2 hours as needed when  you're outdoors.   Staying in the shade or wearing long sleeves, sun glasses (UVA+UVB protection) and wide brim hats (4-inch brim around the entire circumference of the hat) are also recommended for sun protection.  Due to recent changes in healthcare laws, you may see results of your pathology and/or laboratory studies on MyChart before the doctors have had a chance to review them. We understand that in some cases there may be results that are confusing or concerning to you. Please understand that not all results are received at the same time and often the doctors may need to interpret multiple results in order to provide you with the best plan of care or course of treatment. Therefore, we ask that you please give us  2 business days to thoroughly review all your results before contacting the office for clarification. Should we see a critical lab result, you will be contacted sooner.   If You Need Anything After Your Visit  If you have any questions or concerns for your doctor, please call our main line at [phone] and press option 4 to reach your doctor's medical assistant. If no one answers, please leave a voicemail as directed and we will return your call as soon as possible. Messages left after 4 pm will be answered the following business day.   You may also send us  a message via MyChart. We typically respond to MyChart messages within 1-2 business days.  For prescription refills, please ask your pharmacy to contact our office. Our fax number is [phone].  If you have an urgent issue when the clinic is closed that cannot wait until the next business day, you can page your doctor at the number below.    Please note that while we do our best to be available for urgent issues outside of office hours, we are not available 24/7.   If you have an urgent issue and are unable to reach us , you may choose to seek medical care at your doctor's office, retail clinic, urgent care center, or  emergency room.  If you have a medical emergency, please immediately call 911 or go to the emergency department.  Pager Numbers  - Dr. Hester: [phone]  - Dr. Jackquline: [phone]  - Dr. Claudene: [phone]   - Dr. Raymund: [phone]  In the event of inclement weather, please call our main line at [phone] for an update on the status of any delays or closures.  Dermatology Medication Tips: Please keep the boxes that topical medications come in in order to help keep track of the instructions about where and how to use these. Pharmacies typically print the medication instructions only on the boxes and not directly on the medication tubes.   If your medication is too expensive, please contact our office at [phone] option 4 or send us  a message through MyChart.   We are unable to tell what your co-pay for medications will be in advance as this is different depending on your insurance coverage. However, we may be able to find a substitute medication at lower cost or fill out paperwork to get insurance to cover a needed medication.   If a prior authorization is required to get your medication covered by your insurance company, please allow us  1-2 business days to complete this process.  Drug prices often vary depending on where the prescription is filled and some pharmacies may offer cheaper prices.  The website www.goodrx.com contains coupons for medications through different pharmacies. The prices here do not account for what the cost may be with help from insurance (it may be cheaper with your insurance), but the website can give you the price if you did not use any insurance.  - You can print the  associated coupon and take it with your prescription to the pharmacy.  - You may also stop by our office during regular business hours and pick up a GoodRx coupon card.  - If you need your prescription sent electronically to a different pharmacy, notify our office through [facility] or by phone at [phone] option 4.     Si Usted Necesita Algo Despus de Su Visita  Tambin puede enviarnos un mensaje a travs de Clinical cytogeneticist. Por lo general respondemos a los mensajes de MyChart en el transcurso de 1 a 2 das hbiles.  Para renovar recetas, por favor pida a su farmacia que se ponga en contacto con nuestra oficina. Randi lakes de fax es [place] [phone].  Si tiene un asunto urgente cuando la clnica est cerrada y que no puede esperar hasta el siguiente da hbil, puede llamar/localizar a su doctor(a) al nmero que aparece a continuacin.   Por favor, tenga en cuenta que aunque hacemos todo lo posible para estar disponibles para asuntos urgentes fuera del horario de [place], no estamos disponibles las 24 horas del da, los 7 [address]  [address] de la [place].   Si tiene un problema urgente y no puede comunicarse con nosotros, puede optar por buscar atencin mdica  en el consultorio de su doctor(a), en una clnica privada, en un centro de atencin urgente o en una sala de emergencias.  Si tiene Engineer, drilling, por favor llame inmediatamente al 911 o vaya a la sala de emergencias.  Nmeros de bper  - Dr. Hester: [phone]  - Dra. Jackquline: [phone]  - Dr. Claudene: [phone]  - Dra. Kitts: [phone]  En caso de inclemencias del [place], por favor llame a nuestra lnea principal al [phone] para una actualizacin sobre el estado de cualquier retraso o cierre.  Consejos para la medicacin en dermatologa: Por favor, guarde las cajas en las que vienen los medicamentos de uso tpico para ayudarle a seguir las instrucciones sobre dnde y cmo usarlos. Las farmacias generalmente imprimen las instrucciones del medicamento slo en las cajas y no directamente en los tubos del [place].   Si su medicamento es muy caro, por favor, pngase en contacto con landry rieger llamando al [phone] y presione la opcin 4 o envenos un mensaje a travs de  Clinical cytogeneticist.   No podemos decirle cul ser su copago por los medicamentos por adelantado ya que esto es diferente dependiendo de la cobertura de su seguro. Sin embargo, es posible que podamos encontrar un medicamento sustituto a Audiological scientist un formulario para que el seguro cubra el medicamento que se considera necesario.   Si se requiere una autorizacin previa para que su compaa de seguros malta su medicamento, por favor permtanos de 1 a 2 das hbiles para completar este proceso.  Los precios de los medicamentos varan con frecuencia dependiendo del Environmental consultant de dnde se surte la receta y alguna farmacias pueden ofrecer precios ms baratos.  El sitio web www.goodrx.com tiene cupones para medicamentos de Health and safety inspector. Los precios aqu no tienen en cuenta lo que podra costar con la ayuda del seguro (puede ser ms barato con su seguro), pero el sitio web puede darle el precio si no utiliz Tourist information centre manager.  - Puede imprimir el cupn correspondiente y llevarlo con su receta a la farmacia.  - Tambin puede pasar por nuestra oficina durante el horario de atencin regular y Education officer, museum una tarjeta de cupones de GoodRx.  - Si necesita que su receta se enve electrnicamente a [employer],  informe a nuestra oficina a travs de MyChart de [place] o por telfono llamando al [phone] y presione la opcin 4.

## 2024-08-10 NOTE — Progress Notes (Signed)
 Follow-Up Visit   Subjective  Allison Howard is a 58 y.o. female who presents for the following: Skin Cancer Screening and Full Body Skin Exam Hx of mmis, hx of isks, hx of scc Reports some scaly spots at right foot (painful), upper lip (peels off) and left thigh (picks at). She also has multiple other spots that she picks at on hand, arms, legs, scalp.   The patient presents for Total-Body Skin Exam (TBSE) for skin cancer screening and mole check. The patient has spots, moles and lesions to be evaluated, some may be new or changing and the patient may have concern these could be cancer.    The following portions of the chart were reviewed this encounter and updated as appropriate: medications, allergies, medical history  Review of Systems:  No other skin or systemic complaints except as noted in HPI or Assessment and Plan.  Objective  Well appearing patient in no apparent distress; mood and affect are within normal limits.  A full examination was performed including scalp, head, eyes, ears, nose, lips, neck, chest, axillae, abdomen, back, buttocks, bilateral upper extremities, bilateral lower extremities, hands, feet, fingers, toes, fingernails, and toenails. All findings within normal limits unless otherwise noted below.   Relevant physical exam findings are noted in the Assessment and Plan.  right medial foot 6 mm keratotic papule   right upper lip vermillion x 1, right chest x 2 (3) Erythematous thin papules/macules with gritty scale.  right hand dorsum x 6, left upper arm x 1, left anterior thigh x 2, right upper arm x 1, right anterior thigh x 2, frontal scalp x 1 (13) Erythematous stuck-on, waxy papule or plaque  Assessment & Plan   SKIN CANCER SCREENING PERFORMED TODAY.  ACTINIC DAMAGE - Chronic condition, secondary to cumulative UV/sun exposure - diffuse scaly erythematous macules with underlying dyspigmentation - Recommend daily broad spectrum sunscreen SPF 30+ to  sun-exposed areas, reapply every 2 hours as needed.  - Staying in the shade or wearing long sleeves, sun glasses (UVA+UVB protection) and wide brim hats (4-inch brim around the entire circumference of the hat) are also recommended for sun protection.  - Call for new or changing lesions.  LENTIGINES, SEBORRHEIC KERATOSES, HEMANGIOMAS - Benign normal skin lesions - Benign-appearing - Call for any changes  Milia - R med cheek - 2.0 mm firm white papule - type of cyst - benign - sometimes these will clear with nightly OTC adapalene/Differin 0.1% gel or retinol. - may be extracted if symptomatic  - Symptomatic, irritating, patient would like treated.   Benign Destruction procedure for symptomatic milia/cysts: Procedure risks and benefits were discussed with the patient (bleeding, bruising, small scar) and verbal consent was obtained. Following alcohol prep of the skin, the right medial cheek was anesthetized with 1% lido/epi injection. Sharp destruction performed with 11 blade incision and firm pressure with comedone extractor.  Total # lesions destroyed, 1.  Vaseline ointment was applied to each site. The patient tolerated the procedure well.   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 Condition is improving with treatment but not currently at goal.   Treatment Plan: restart pimecrolimus  every day/bid prn itchy rash face    MELANOCYTIC NEVI - Tan-brown and/or pink-flesh-colored symmetric macules and papules - 4 mm med dark brown macule at right spinal lower back, unchanged when compared to photo - 6 x  3 mm brown macule at right flank, unchanged when compared to photo - 4 x 2 mm brown macules slight irregular pigment L spinal upper back , unchanged when compared to photo - Benign appearing on exam today - Observation - Call clinic for new or changing  moles - Recommend daily use of broad spectrum spf 30+ sunscreen to sun-exposed areas.   History of Squamous Cell Carcinoma of the Skin - Left pretibia clear with biopsy (10/30/2021). Right upper lip, clear (Mohs 01/01/2022  - No evidence of recurrence today, crusting at R upper lip due to recent sunburn - Recommend regular full body skin exams - Recommend daily broad spectrum sunscreen SPF 30+ to sun-exposed areas, reapply every 2 hours as needed.  - Call if any new or changing lesions are noted between office visits   History of Melanoma in Situ- 08/2021 - No evidence of recurrence today at left lower back - Recommend regular full body skin exams - Recommend daily broad spectrum sunscreen SPF 30+ to sun-exposed areas, reapply every 2 hours as needed.  - Call if any new or changing lesions are noted between office visits   NEOPLASM OF UNCERTAIN BEHAVIOR right medial foot Epidermal / dermal shaving  Lesion diameter (cm):  0.6 Informed consent: discussed and consent obtained   Patient was prepped and draped in usual sterile fashion: Area prepped with alcohol. Anesthesia: the lesion was anesthetized in a standard fashion   Anesthetic:  1% lidocaine  w/ epinephrine 1-100,000 buffered w/ 8.4% NaHCO3 Instrument used: flexible razor blade   Hemostasis achieved with: pressure, aluminum chloride and electrodesiccation   Outcome: patient tolerated procedure well    Destruction of lesion  Destruction method: electrodesiccation and curettage   Informed consent: discussed and consent obtained   Curettage performed in three different directions: Yes   Electrodesiccation performed over the curetted area: Yes   Final wound size (cm):  0.7 Hemostasis achieved with:  pressure, aluminum chloride and electrodesiccation Outcome: patient tolerated procedure well with no complications   Post-procedure details: wound care instructions given   Additional details:  Mupirocin  ointment and Bandaid applied    Specimen 1 - Surgical pathology Differential Diagnosis: Wart vs hypertropic ak vs isk  r/o scc ED&C done today  Check Margins: No Wart vs hypertropic ak vs isk  r/o scc ACTINIC KERATOSIS (3) right upper lip vermillion x 1, right chest x 2 (3) Actinic keratoses are precancerous spots that appear secondary to cumulative UV radiation exposure/sun exposure over time. They are chronic with expected duration over 1 year. A portion of actinic keratoses will progress to squamous cell carcinoma of the skin. It is not possible to reliably predict which spots will progress to skin cancer and so treatment is recommended to prevent development of skin cancer.  Recommend daily broad spectrum sunscreen SPF 30+ to sun-exposed areas, reapply every 2 hours as needed.  Recommend staying in the shade or wearing long sleeves, sun glasses (UVA+UVB protection) and wide brim hats (4-inch brim around the entire circumference of the hat). Call for new or changing lesions. Destruction of lesion - right upper lip vermillion x 1, right chest x 2 (3)  Destruction method: cryotherapy   Informed consent: discussed and consent obtained   Lesion destroyed using liquid nitrogen: Yes   Region frozen until ice ball extended beyond lesion: Yes   Outcome: patient tolerated procedure well with no complications   Post-procedure details: wound care instructions given   Additional details:  Prior to procedure, discussed risks of blister formation,  small wound, skin dyspigmentation, or rare scar following cryotherapy. Recommend Vaseline ointment to treated areas while healing.   INFLAMED SEBORRHEIC KERATOSIS (13) right hand dorsum x 6, left upper arm x 1, left anterior thigh x 2, right upper arm x 1, right anterior thigh x 2, frontal scalp x 1 (13) Symptomatic, irritating, patient would like treated. Destruction of lesion - right hand dorsum x 6, left upper arm x 1, left anterior thigh x 2, right upper arm x 1, right anterior  thigh x 2, frontal scalp x 1 (13)  Destruction method: cryotherapy   Informed consent: discussed and consent obtained   Lesion destroyed using liquid nitrogen: Yes   Region frozen until ice ball extended beyond lesion: Yes   Outcome: patient tolerated procedure well with no complications   Post-procedure details: wound care instructions given   Additional details:  Prior to procedure, discussed risks of blister formation, small wound, skin dyspigmentation, or rare scar following cryotherapy. Recommend Vaseline ointment to treated areas while healing.   Return in about 6 months (around 02/10/2025) for TBSE.  I, Eleanor Blush, CMA, am acting as scribe for Rexene Rattler, MD.   Documentation: I have reviewed the above documentation for accuracy and completeness, and I agree with the above.  Rexene Rattler, MD

## 2024-08-12 LAB — SURGICAL PATHOLOGY

## 2024-08-18 ENCOUNTER — Ambulatory Visit: Payer: Self-pay | Admitting: Dermatology

## 2024-08-18 ENCOUNTER — Encounter: Payer: Self-pay | Admitting: Dermatology

## 2024-08-18 NOTE — Telephone Encounter (Signed)
-----   Message from Rexene Rattler sent at 08/18/2024 10:53 AM EDT ----- 1. Skin, right medial foot :       WELL DIFFERENTIATED SQUAMOUS CELL CARCINOMA, BASE INVOLVED   SCC skin cancer- already treated with EDC at time of biopsy  - please call patient ----- Message ----- From: Interface, [facility] Sent: 08/12/2024   6:19 PM EDT To: Rexene Rattler, MD

## 2024-08-18 NOTE — Telephone Encounter (Signed)
 Advised pt of bx results/sh ?

## 2024-08-24 ENCOUNTER — Ambulatory Visit (INDEPENDENT_AMBULATORY_CARE_PROVIDER_SITE_OTHER)

## 2024-08-24 ENCOUNTER — Ambulatory Visit: Payer: Self-pay | Admitting: Family Medicine

## 2024-08-24 ENCOUNTER — Encounter: Payer: Self-pay | Admitting: Family Medicine

## 2024-08-24 ENCOUNTER — Ambulatory Visit: Admitting: Family Medicine

## 2024-08-24 VITALS — BP 98/68 | HR 80 | Temp 98.0°F | Wt 162.9 lb

## 2024-08-24 DIAGNOSIS — G8929 Other chronic pain: Secondary | ICD-10-CM

## 2024-08-24 DIAGNOSIS — M546 Pain in thoracic spine: Secondary | ICD-10-CM | POA: Diagnosis not present

## 2024-08-24 DIAGNOSIS — I10 Essential (primary) hypertension: Secondary | ICD-10-CM | POA: Diagnosis not present

## 2024-08-24 DIAGNOSIS — E876 Hypokalemia: Secondary | ICD-10-CM | POA: Diagnosis not present

## 2024-08-24 DIAGNOSIS — Z7985 Long-term (current) use of injectable non-insulin antidiabetic drugs: Secondary | ICD-10-CM

## 2024-08-24 DIAGNOSIS — E1165 Type 2 diabetes mellitus with hyperglycemia: Secondary | ICD-10-CM

## 2024-08-24 DIAGNOSIS — R600 Localized edema: Secondary | ICD-10-CM

## 2024-08-24 LAB — POCT GLYCOSYLATED HEMOGLOBIN (HGB A1C): Hemoglobin A1C: 5.3 % (ref 4.0–5.6)

## 2024-08-24 LAB — BASIC METABOLIC PANEL WITH GFR
BUN: 18 mg/dL (ref 6–23)
CO2: 34 meq/L — ABNORMAL HIGH (ref 19–32)
Calcium: 10.9 mg/dL — ABNORMAL HIGH (ref 8.4–10.5)
Chloride: 89 meq/L — ABNORMAL LOW (ref 96–112)
Creatinine, Ser: 1.06 mg/dL (ref 0.40–1.20)
GFR: 58.82 mL/min — ABNORMAL LOW (ref >60.00)
Glucose, Bld: 105 mg/dL — ABNORMAL HIGH (ref 70–99)
Potassium: 3.4 meq/L — ABNORMAL LOW (ref 3.5–5.1)
Sodium: 136 meq/L (ref 135–145)

## 2024-08-24 MED ORDER — ALBUTEROL SULFATE HFA 108 (90 BASE) MCG/ACT IN AERS: 2.0000 | INHALATION_SPRAY | RESPIRATORY_TRACT | 5 refills | Status: AC | PRN

## 2024-08-24 MED ORDER — FUROSEMIDE 20 MG PO TABS: 20.0000 mg | ORAL_TABLET | ORAL | Status: DC

## 2024-08-24 MED ORDER — POTASSIUM CHLORIDE ER 10 MEQ PO CPCR: 10.0000 meq | ORAL_CAPSULE | Freq: Two times a day (BID) | ORAL | 3 refills | Status: DC

## 2024-08-24 NOTE — Progress Notes (Signed)
   Subjective:    Patient ID: Allison Howard, female    DOB: 06/11/1966, 58 y.o.   MRN: [id number]  HPI Here to follow up on issues and to discuss a new one. She has been watching her diet and exercising, in addition to taking Ozempic. She has lsot 20 lbs since her visit here in June, and she feels better. Her BP has averaged 100/60 at home. She does mention a burning pain in the middel of her back that started one month ago. No recent trauma. The pain is centered between the shoulder blades.    Review of Systems  Constitutional: Negative.   Respiratory: Negative.    Cardiovascular: Negative.   Musculoskeletal:  Positive for back pain.       Objective:   Physical Exam Constitutional:      Appearance: Normal appearance.  Cardiovascular:     Rate and Rhythm: Normal rate and regular rhythm.     Pulses: Normal pulses.     Heart sounds: Normal heart sounds.  Pulmonary:     Effort: Pulmonary effort is normal.     Breath sounds: Normal breath sounds.  Musculoskeletal:     Comments: She tender over the spine as well as to the sides of the spine in the mid thoracic area. ROM of the spine is full   Neurological:     Mental Status: She is alert.           Assessment & Plan:  She is losing weight with the help of Ozempic, and she will continue for now. Her type 2 diabetes is noe well controlled. Her HTN is also well controlled, and in fact we will stop the Triamterene -hydrochlorothiazide. She will take Furosemide  every other day for the leg edema. We will check a BMET today for the hypokalemia, and she will reduce the potassium pills to 10 mEq BID for now. We will investigate the thoracic pain with thoracic spine Xrays. Garnette Olmsted, MD

## 2024-08-24 NOTE — Addendum Note (Signed)
 Addended by: LADONNA INOCENTE SAILOR on: 08/24/2024 11:12 AM   Modules accepted: Orders

## 2024-08-26 ENCOUNTER — Encounter: Payer: Self-pay | Admitting: Family Medicine

## 2024-08-27 ENCOUNTER — Other Ambulatory Visit: Payer: Self-pay

## 2024-08-27 MED ORDER — FUROSEMIDE 20 MG PO TABS: 20.0000 mg | ORAL_TABLET | ORAL | 0 refills | Status: DC

## 2024-08-27 NOTE — Telephone Encounter (Signed)
 Spoke with pt 30 days of lasix  sent to [facility] and 90 days sent to her [facility] local pharmacy. Pt labs faxed as requested

## 2024-09-02 NOTE — Telephone Encounter (Signed)
 Okay, then keep taking 2 Lasix  tablets a day

## 2024-09-07 ENCOUNTER — Other Ambulatory Visit: Payer: Self-pay

## 2024-09-07 MED ORDER — TORSEMIDE 20 MG PO TABS: 20.0000 mg | ORAL_TABLET | Freq: Every day | ORAL | 2 refills | Status: DC

## 2024-09-07 NOTE — Telephone Encounter (Signed)
 I saw the photos. Last week we changed her from Furosemide  to Torsemide  (which is a stronger diuertic). Hopefully this will help

## 2024-10-07 ENCOUNTER — Ambulatory Visit: Admitting: Family Medicine

## 2024-10-07 ENCOUNTER — Encounter: Payer: Self-pay | Admitting: Family Medicine

## 2024-10-07 VITALS — BP 108/68 | HR 74 | Temp 98.2°F | Wt 160.2 lb

## 2024-10-07 DIAGNOSIS — E876 Hypokalemia: Secondary | ICD-10-CM | POA: Diagnosis not present

## 2024-10-07 DIAGNOSIS — R600 Localized edema: Secondary | ICD-10-CM

## 2024-10-07 DIAGNOSIS — I10 Essential (primary) hypertension: Secondary | ICD-10-CM

## 2024-10-07 DIAGNOSIS — Z7985 Long-term (current) use of injectable non-insulin antidiabetic drugs: Secondary | ICD-10-CM

## 2024-10-07 DIAGNOSIS — E119 Type 2 diabetes mellitus without complications: Secondary | ICD-10-CM | POA: Diagnosis not present

## 2024-10-07 LAB — BASIC METABOLIC PANEL WITH GFR
BUN: 17 mg/dL (ref 6–23)
CO2: 29 meq/L (ref 19–32)
Calcium: 9.9 mg/dL (ref 8.4–10.5)
Chloride: 100 meq/L (ref 96–112)
Creatinine, Ser: 1.06 mg/dL (ref 0.40–1.20)
GFR: 58.77 mL/min — ABNORMAL LOW (ref >60.00)
Glucose, Bld: 89 mg/dL (ref 70–99)
Potassium: 3.7 meq/L (ref 3.5–5.1)
Sodium: 140 meq/L (ref 135–145)

## 2024-10-07 MED ORDER — POTASSIUM CHLORIDE ER 10 MEQ PO CPCR: 20.0000 meq | ORAL_CAPSULE | Freq: Two times a day (BID) | ORAL | Status: DC

## 2024-10-07 MED ORDER — TORSEMIDE 20 MG PO TABS: 20.0000 mg | ORAL_TABLET | Freq: Every day | ORAL | 2 refills | Status: DC

## 2024-10-07 NOTE — Progress Notes (Signed)
   Subjective:    Patient ID: Allison Howard, female    DOB: Sep 25, 1966, 58 y.o.   MRN: [id number]  HPI Here to follow up on HTN, leg edema, type 2 diabetes, and hypokalemia. She has been taking 1 mg weekly of Ozempic and 20 mg of Torsemide  daily. The leg swelling has decreased dramatically, and she is veyr pleased with this. She has lost 2 more lbs since her last visit here. Her potassium was 3.4 so we increased her potassium dosage to 20 mEq BID. In general she feels quite well. Her last A1c on 08-24-24 was 5.5%.    Review of Systems  Constitutional: Negative.   Respiratory: Negative.    Cardiovascular:  Positive for leg swelling.       Objective:   Physical Exam Constitutional:      Appearance: Normal appearance.  Cardiovascular:     Rate and Rhythm: Normal rate and regular rhythm.     Pulses: Normal pulses.     Heart sounds: Normal heart sounds.  Pulmonary:     Effort: Pulmonary effort is normal.     Breath sounds: Normal breath sounds.  Musculoskeletal:     Comments: Trace edema in both lower legs   Neurological:     Mental Status: She is alert.           Assessment & Plan:  Her type 2 diabetes is well controlled, and we agreed to continue taking 1 mg of Ozempic weekly. Her leg edema is well controlled on Torsemide  20 mg daily, so we will continue this. We will check a BMET today to follow the potassium level.  Garnette Olmsted, MD

## 2024-10-08 ENCOUNTER — Ambulatory Visit: Payer: Self-pay | Admitting: Family Medicine

## 2024-10-22 ENCOUNTER — Other Ambulatory Visit: Payer: Self-pay | Admitting: Family Medicine

## 2024-12-01 ENCOUNTER — Ambulatory Visit: Payer: Self-pay | Admitting: Family Medicine

## 2024-12-01 ENCOUNTER — Encounter: Payer: Self-pay | Admitting: Family Medicine

## 2024-12-01 VITALS — BP 110/64 | HR 75 | Temp 98.6°F | Wt 156.0 lb

## 2024-12-01 DIAGNOSIS — E119 Type 2 diabetes mellitus without complications: Secondary | ICD-10-CM | POA: Diagnosis not present

## 2024-12-01 DIAGNOSIS — E876 Hypokalemia: Secondary | ICD-10-CM | POA: Diagnosis not present

## 2024-12-01 DIAGNOSIS — I10 Essential (primary) hypertension: Secondary | ICD-10-CM

## 2024-12-01 DIAGNOSIS — R600 Localized edema: Secondary | ICD-10-CM

## 2024-12-01 LAB — BASIC METABOLIC PANEL WITH GFR
BUN: 24 mg/dL — ABNORMAL HIGH (ref 6–23)
CO2: 31 meq/L (ref 19–32)
Calcium: 10 mg/dL (ref 8.4–10.5)
Chloride: 100 meq/L (ref 96–112)
Creatinine, Ser: 1.04 mg/dL (ref 0.40–1.20)
GFR: 60.07 mL/min (ref >60.00)
Glucose, Bld: 90 mg/dL (ref 70–99)
Potassium: 3.9 meq/L (ref 3.5–5.1)
Sodium: 139 meq/L (ref 135–145)

## 2024-12-01 LAB — POCT GLYCOSYLATED HEMOGLOBIN (HGB A1C): Hemoglobin A1C: 5.2 % (ref 4.0–5.6)

## 2024-12-01 NOTE — Addendum Note (Signed)
 Addended by: LADONNA INOCENTE SAILOR on: 12/01/2024 10:08 AM   Modules accepted: Orders

## 2024-12-01 NOTE — Progress Notes (Signed)
° °  Subjective:    Patient ID: Allison Howard, female    DOB: 04-14-1966, 58 y.o.   MRN: [id number]  HPI Here to follow up on issues. She feels great. Her leg swelling has been well controlled. She stopped taking Ozempic 2 weeks ago, and she is still down 4 lbs from her last visit here. Her A1c today is 5.2%. Her BP has been stable.    Review of Systems  Constitutional: Negative.   Respiratory: Negative.    Cardiovascular: Negative.        Objective:   Physical Exam Constitutional:      Appearance: Normal appearance.  Cardiovascular:     Rate and Rhythm: Normal rate and regular rhythm.     Pulses: Normal pulses.     Heart sounds: Normal heart sounds.  Pulmonary:     Effort: Pulmonary effort is normal.     Breath sounds: Normal breath sounds.  Musculoskeletal:     Right lower leg: No edema.     Left lower leg: No edema.  Neurological:     Mental Status: She is alert.           Assessment & Plan:  Her HTN and type 2 diabetes are well controlled. Her leg edema is stable. We will get a BMET today to check her potassium level. Garnette Olmsted, MD

## 2025-01-01 ENCOUNTER — Other Ambulatory Visit: Payer: Self-pay | Admitting: Family Medicine

## 2025-02-08 ENCOUNTER — Ambulatory Visit: Admitting: Dermatology
